# Patient Record
Sex: Male | Born: 1990 | Race: White | Hispanic: No | Marital: Married
Health system: Midwestern US, Academic
[De-identification: ages and names within clinical notes are randomized; demographics above are authoritative.]

---

## 2016-06-01 IMAGING — CR LOW_EXM
2 series · 2 of 2 positions shown · non-contrast
Comparison: none

[foot]
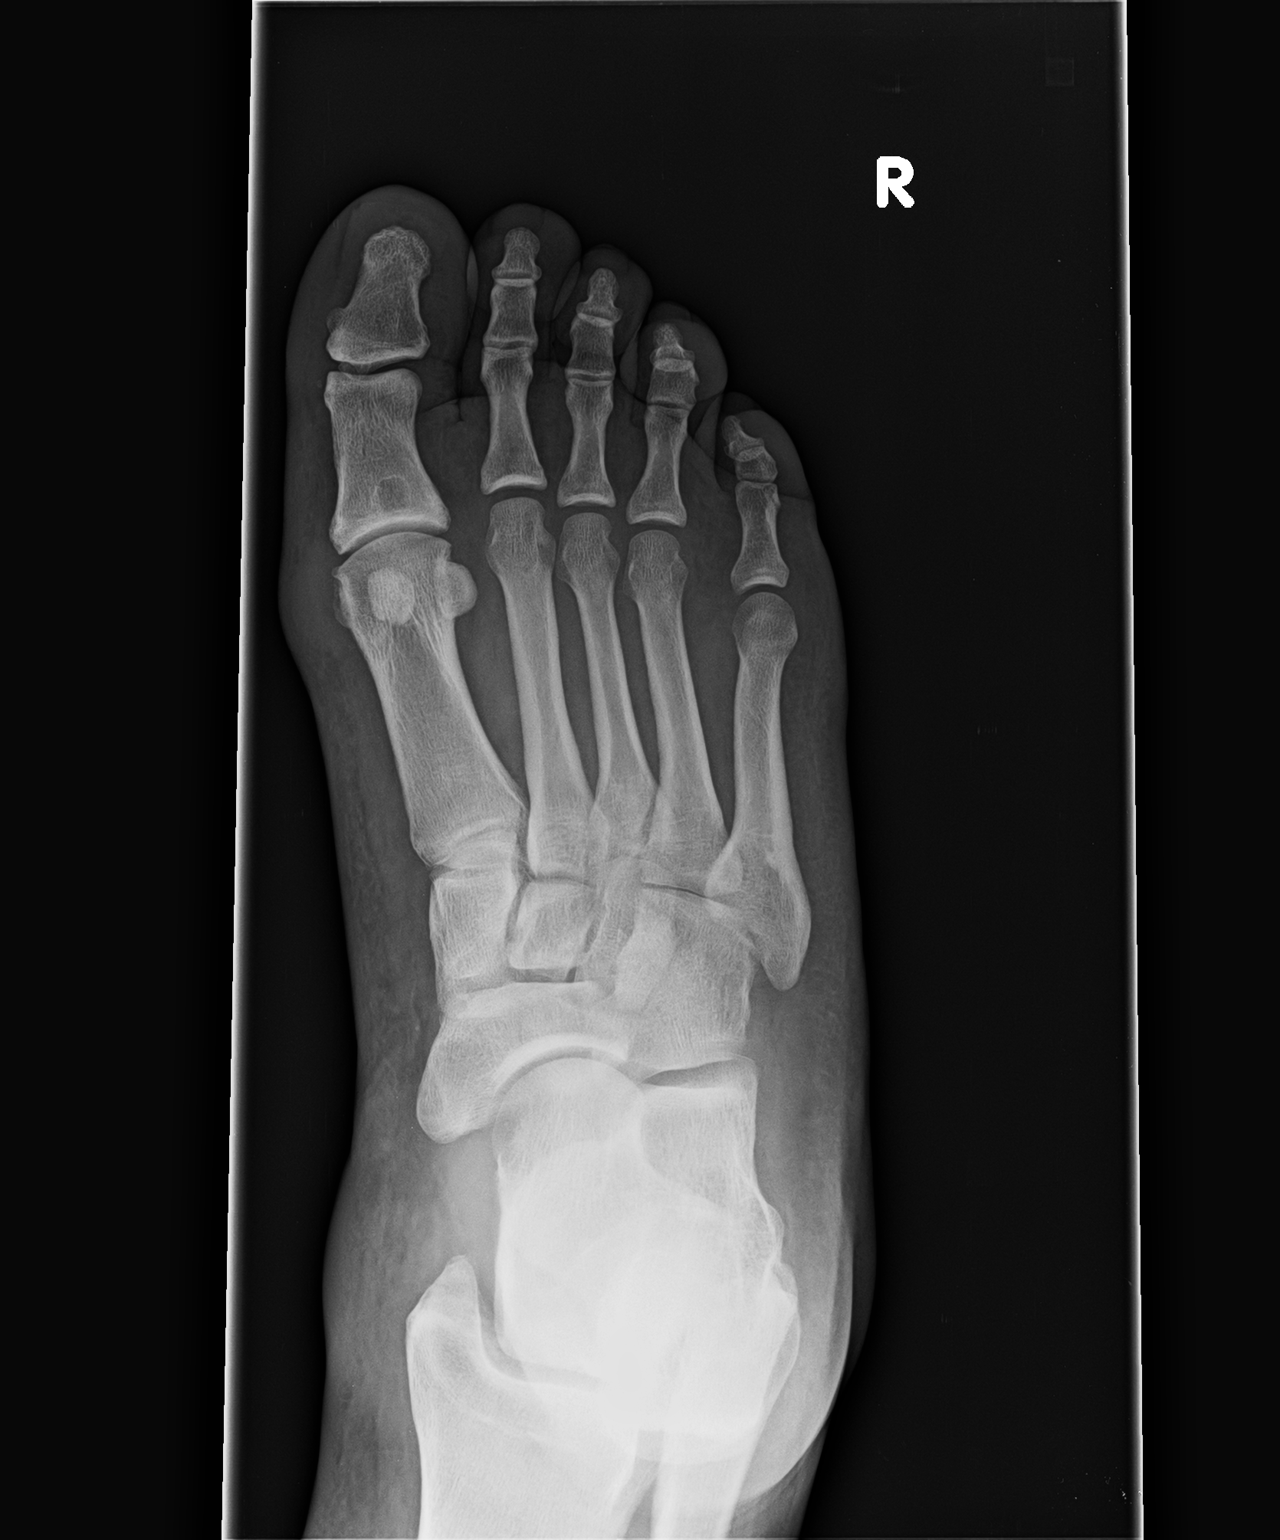

[foot lat]
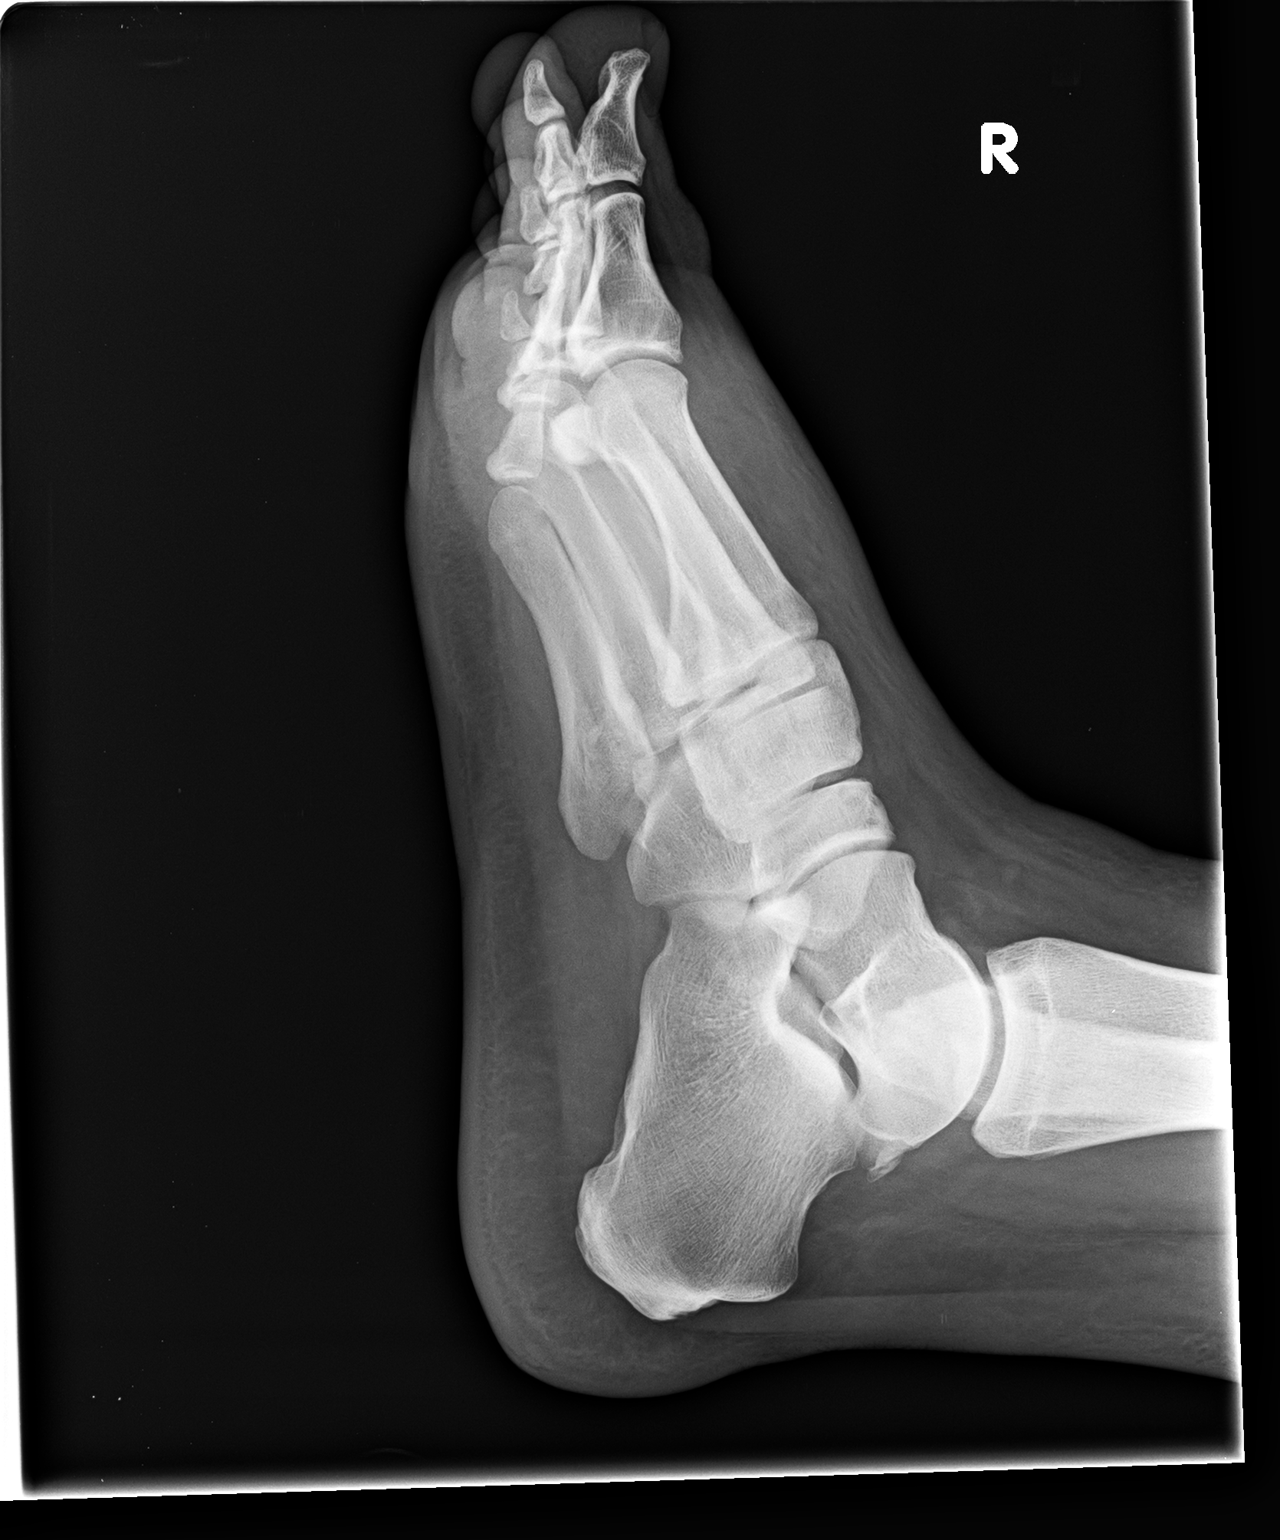

[2 of 2 positions shown; findings below may reference images not displayed]

DIAGNOSTIC STUDIES

EXAM

INDICATION

Hx gout flare of R 1st MTP. Atrumatic pain, swelling same loc.
C/O FOOT PAIN AND SWELLING W/O INJURY. H/O GOUT. PT STATES FEELS LIKE GOUT
FLARE UP, BUT HAS LASTED MUCH LONGER THAN PREVIOUS. PRIOR ANKLE FX.
SHIELDED. HB

TECHNIQUE

Two views of the foot

COMPARISONS

None

FINDINGS

Bone density is normal. There is no evidence of fracture. There is a possible soft tissue tophus
lesion involving the medial 1st metacarpophalangeal joint with an adjacent erosion of the head of
the 1st metatarsal medially and the medial base of the proximal phalanx. Additionally, there is a
cystic lesion in the base of the proximal phalanx of the 1st toe. These can all findings can all be
seen in gout. There is mild dorsal soft tissue swelling. No fracture is seen.

IMPRESSION

findings are consistent with the clinical diagnosis of gout with possible tophus soft tissue
tophus medially adjacent to the 1st metatarsophalangeal joint and probable cyst in the base of the
1st toe proximal phalanx. No fracture

## 2017-04-05 IMAGING — CR LOW_EXM
2 series · 2 of 2 positions shown · non-contrast
Comparison: none

[ankle ap]
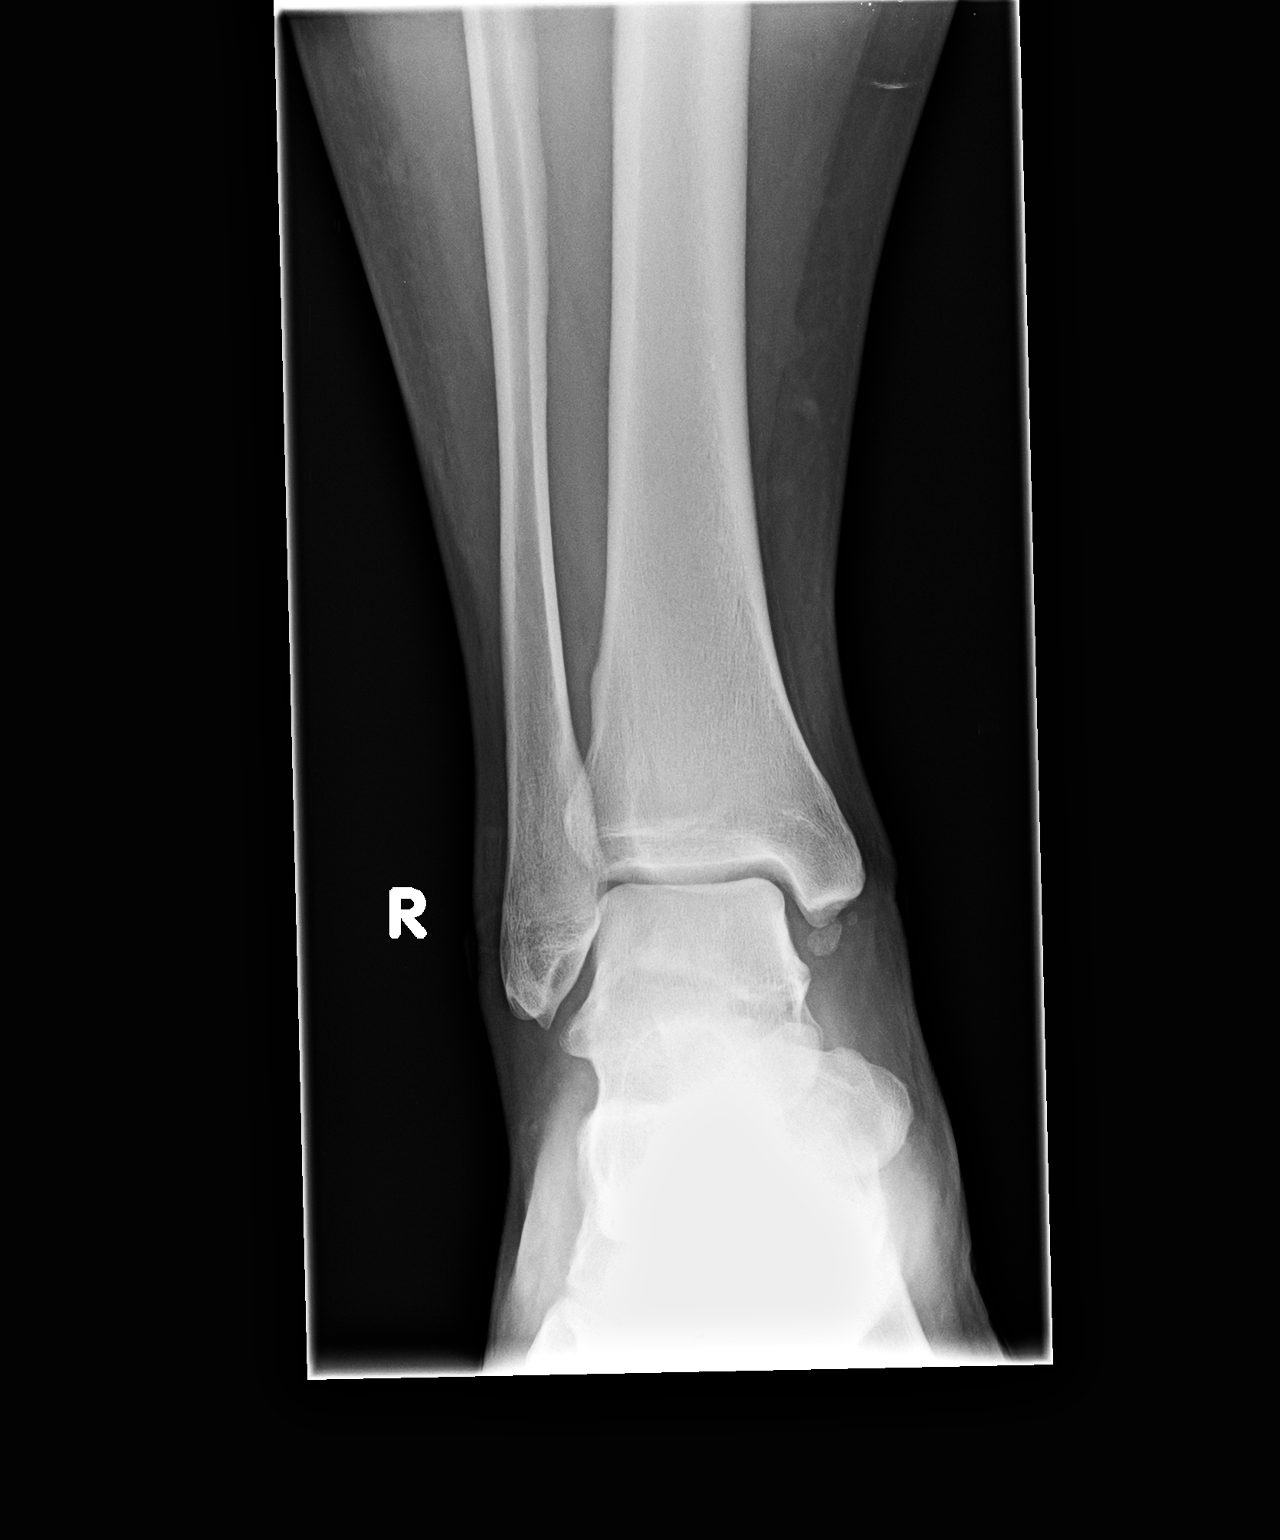

[ankle lat]
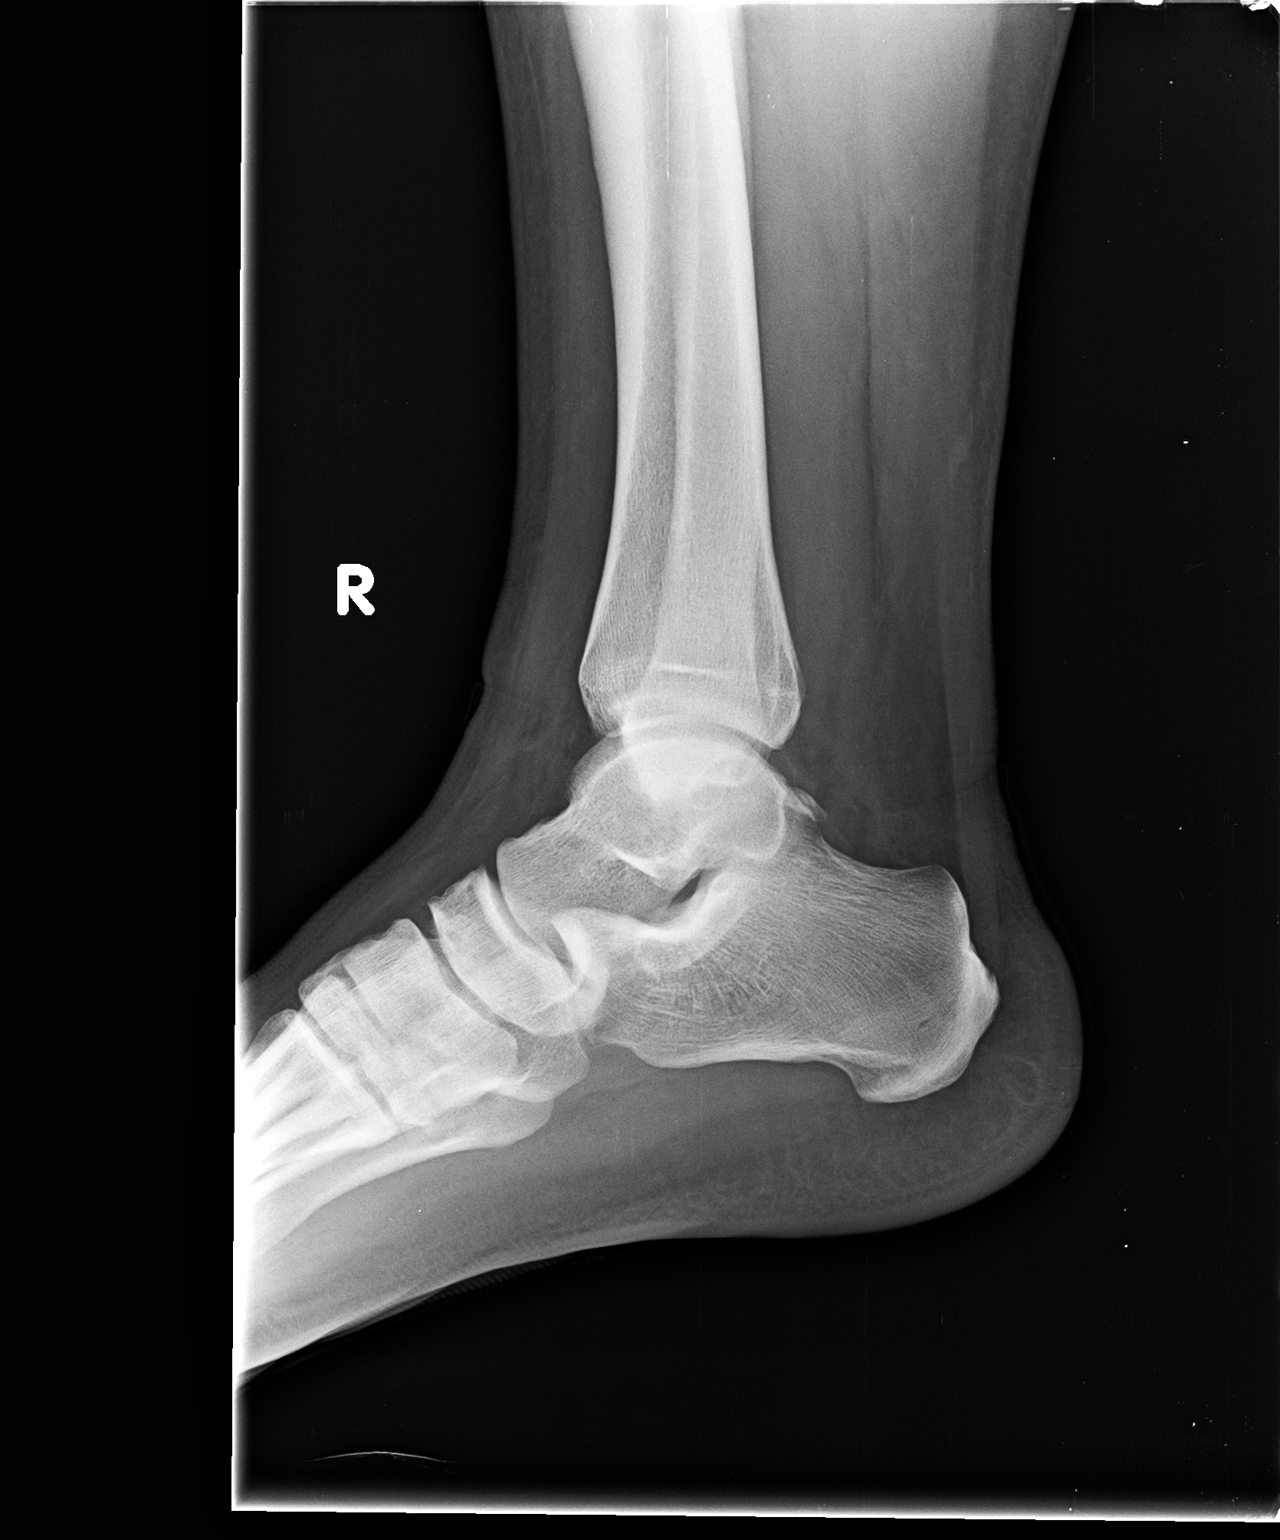

[2 of 2 positions shown; findings below may reference images not displayed]

DIAGNOSTIC STUDIES

EXAM

Two-view right ankle

INDICATION

Right ankle pain, gout.

TECHNIQUE

Frontal and lateral views

COMPARISONS

None

FINDINGS

Sequela of old injury or ossicles are noted along the medial malleolus. No acute displaced
fracture or malalignment. Talar dome is smooth. No erosive change. No destructive bone lesion.

IMPRESSION

No acute bony abnormality.

## 2018-06-19 IMAGING — US VDUPLERT
1 series · 14 of 25 positions shown · non-contrast
Comparison: none

PROCEDURE: VDUPLERT
HISTORY: pain, swelling rle

[Series 1: us venous duplex low ext right · portal-venous · 14 of 30 slices shown]
[im 1/30]
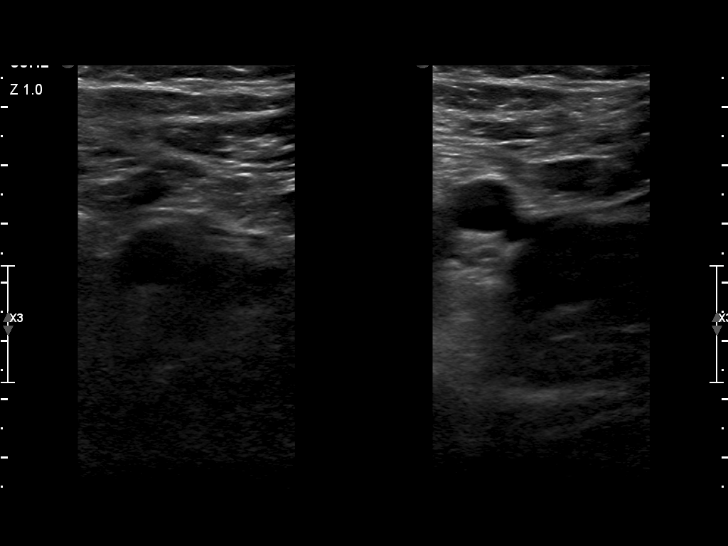
[im 3/30]
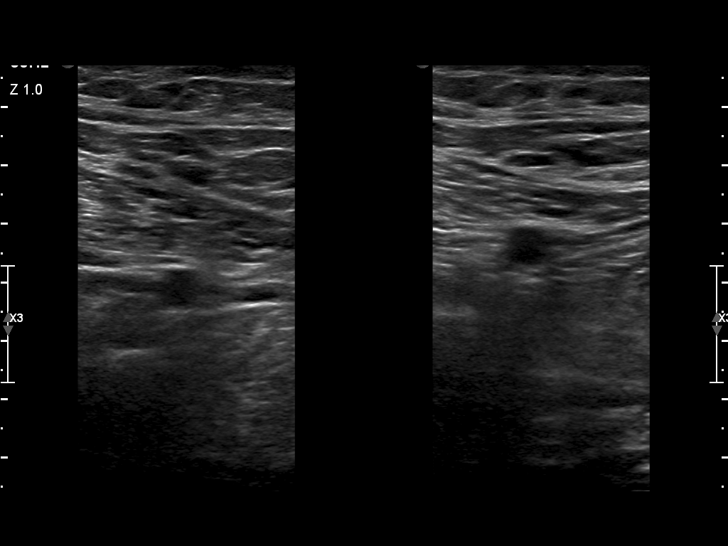
[im 5/30]
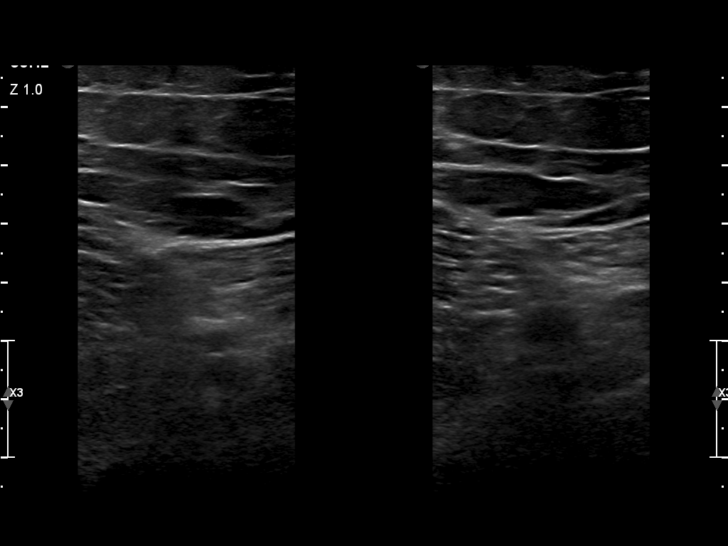
[im 8/30]
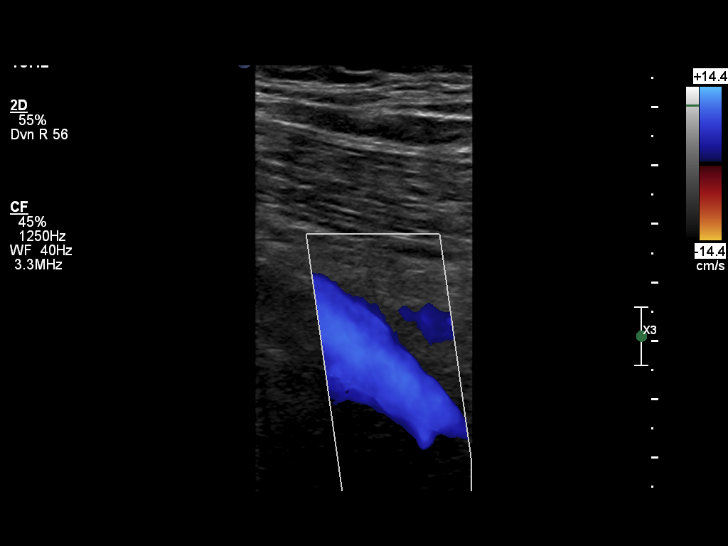
[im 10/30]
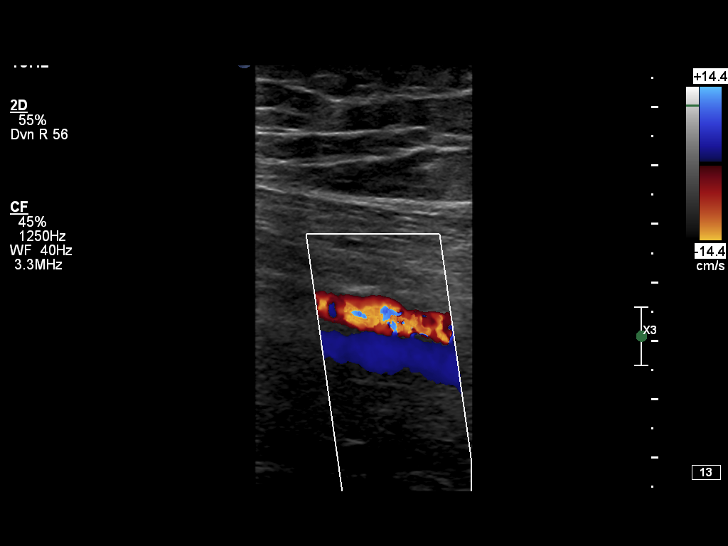
[im 11/30]
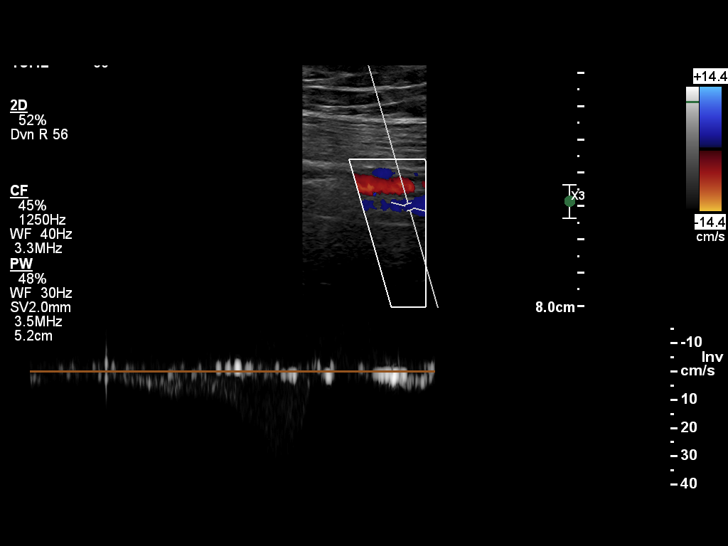
[im 14/30]
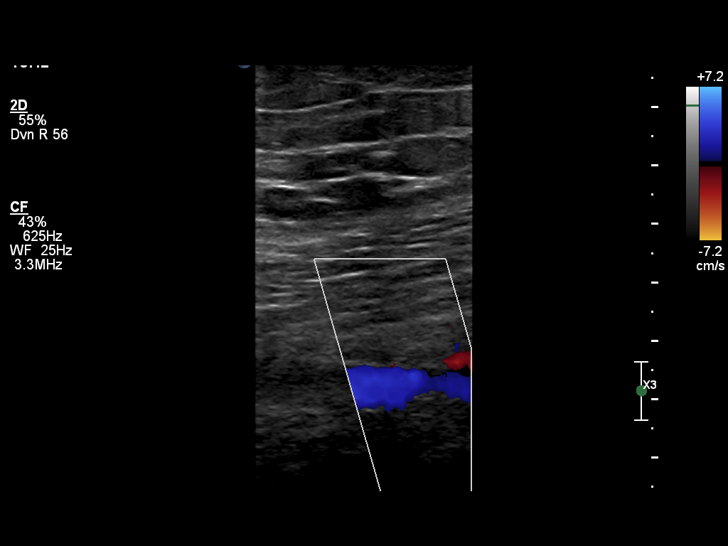
[im 16/30]
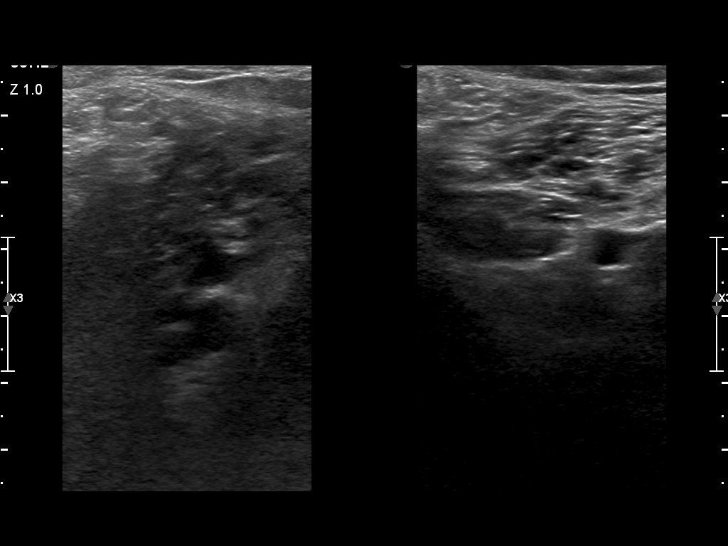
[im 19/30]
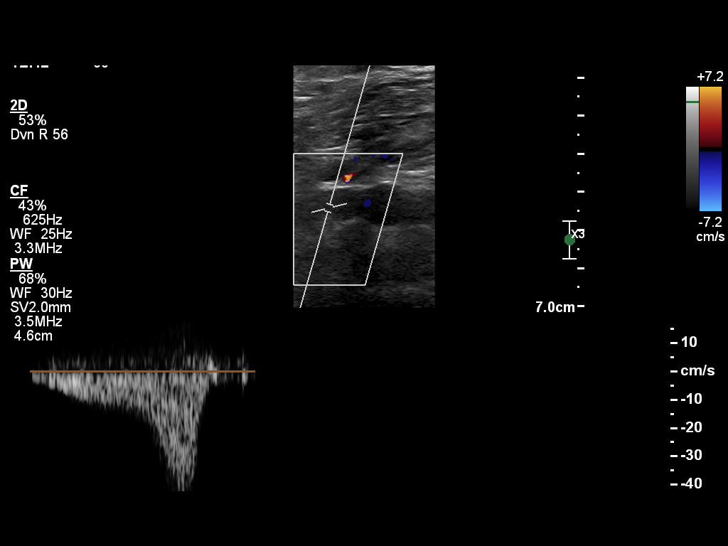
[im 20/30]
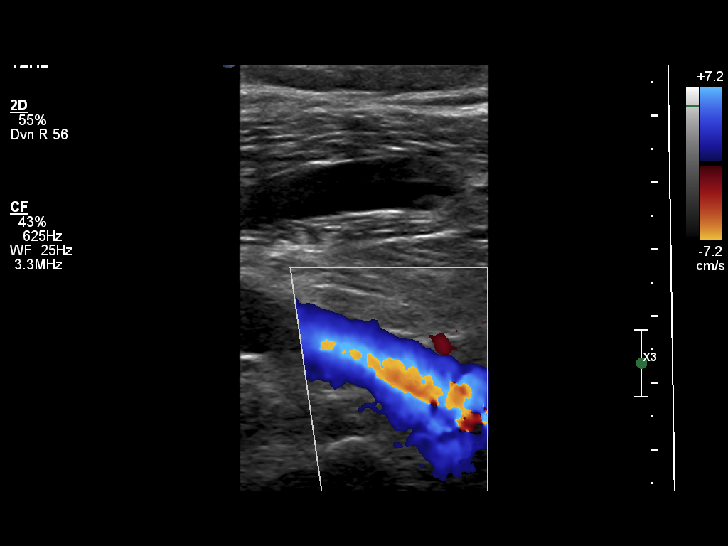
[im 22/30]
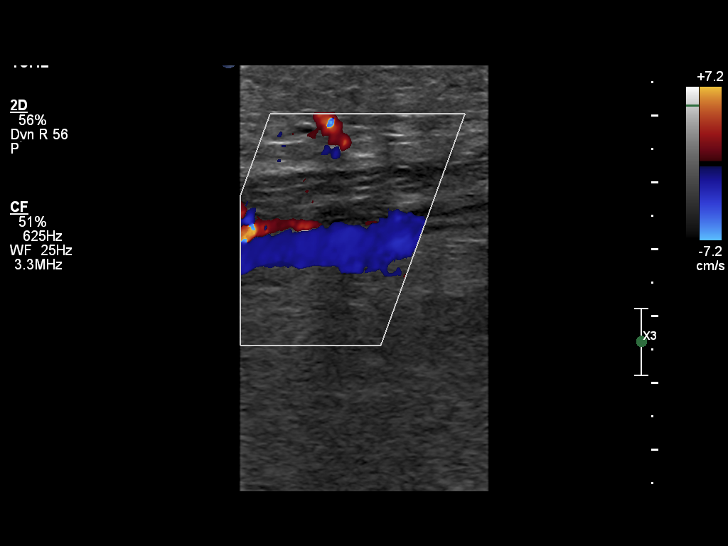
[im 25/30]
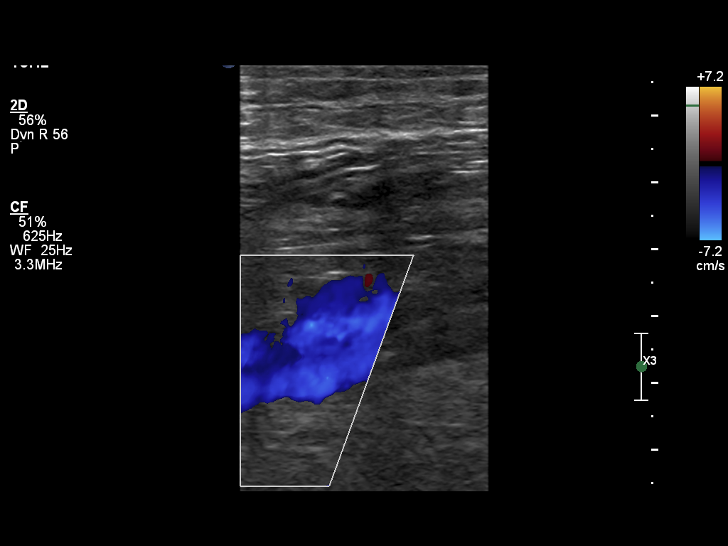
[im 27/30]
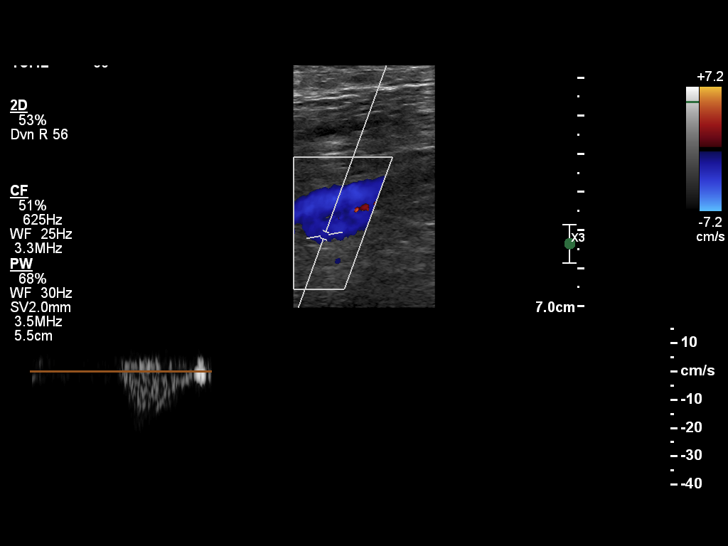
[im 30/30]
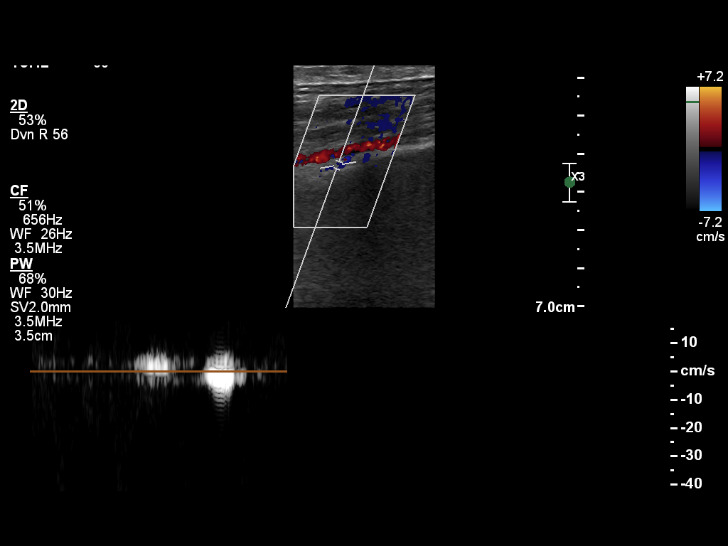

[14 of 25 positions shown; findings below may reference images not displayed]

FINDINGS: Duplex scan was performed using B-mode/gray scale imaging and Doppler spectral analysis
and
color flow of the right lower extremity shows no echogenic thrombus in the common femoral,
superficial
femoral, or popliteal veins. There is good compressibility of the compressible portions of the deep
venous
system. Normal color Doppler flow and phasic spectral Doppler wave forms are seen. Appropriate
response
is seen to augmentation.
IMPRESSION: No deep venous thrombosis is seen in the right lower extremity.

Tech Notes:

jl

## 2018-10-06 IMAGING — CR LOW_EXM
1 series · 1 of 1 positions shown · non-contrast
Comparison: none

[ankle lat]
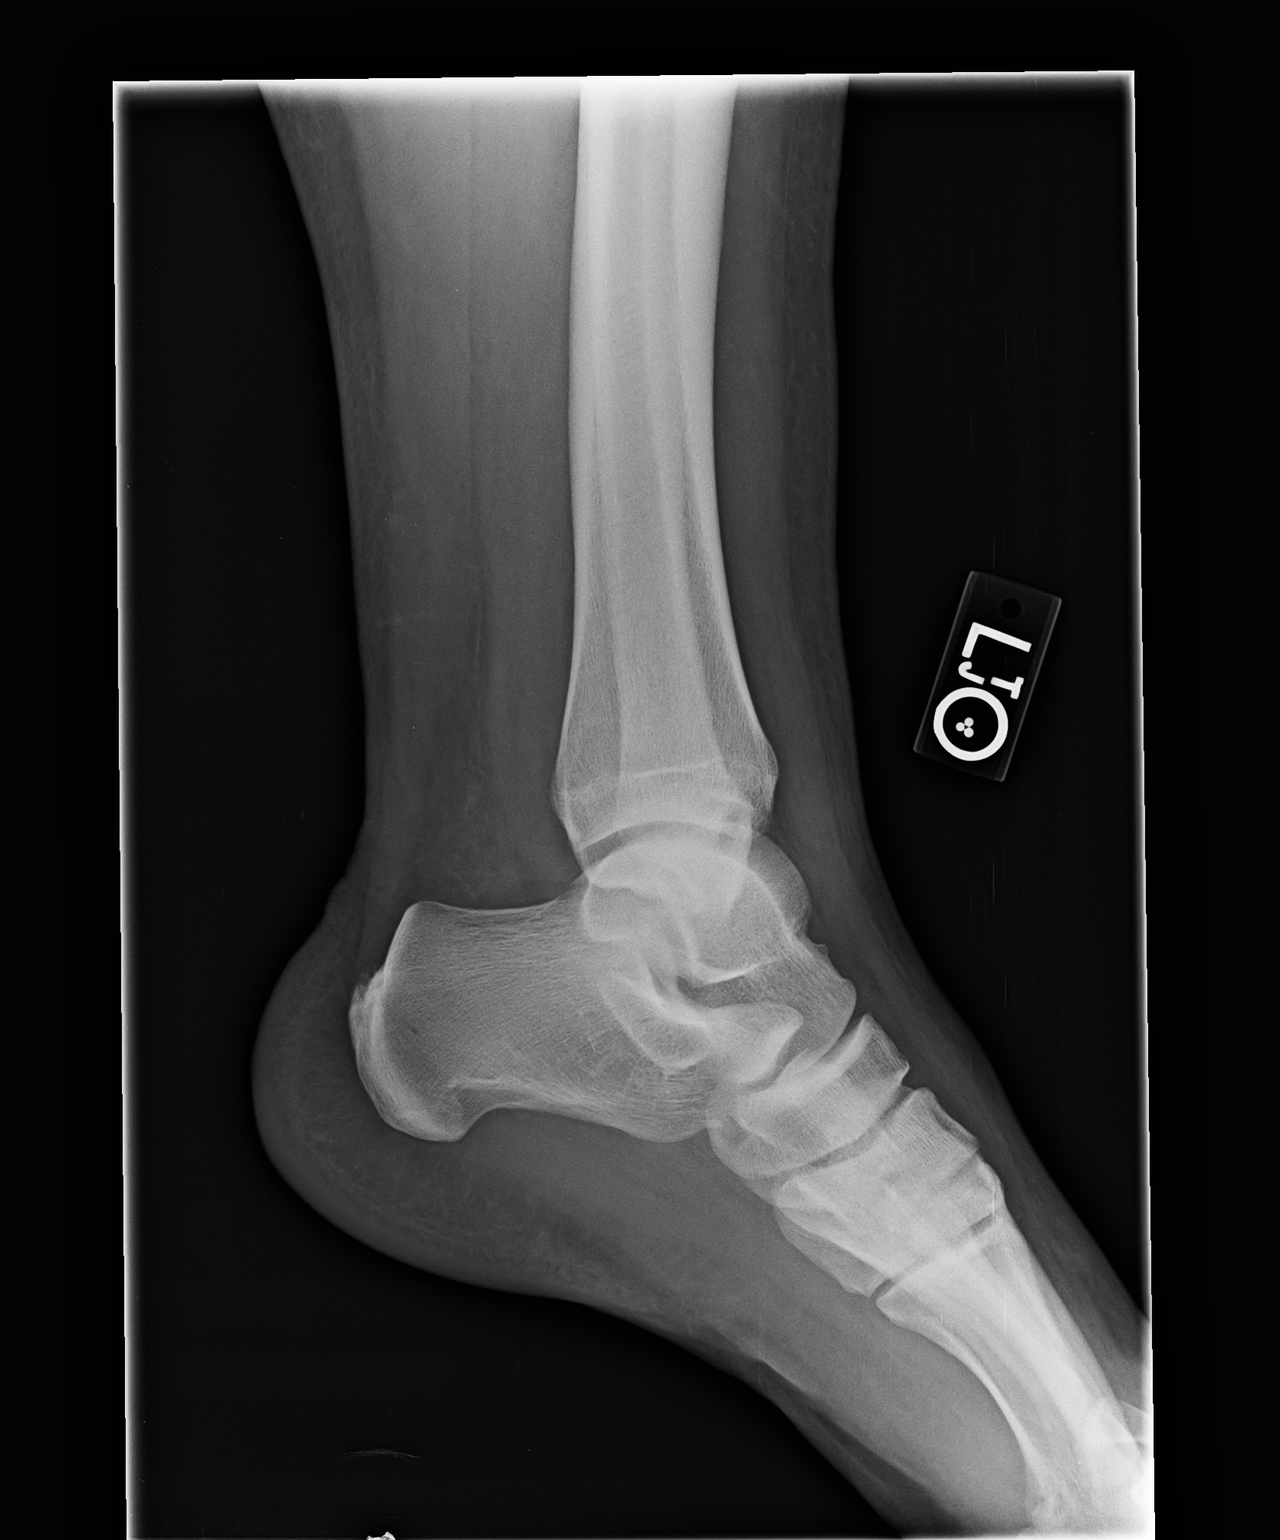

[1 of 1 positions shown; findings below may reference images not displayed]

EXAM

3 VIEW LEFT ANKLE

INDICATION

Left ankle pain

COMPARISONS

None available

FINDINGS

The ankle mortise is congruent without displaced fracture or malalignment. The talar dome is smooth
without focal lucency. No destructive bone lesion is identified. Ossicle or sequela of old injury
adjacent to the medial malleolus.

IMPRESSION

No acute bony abnormality.

Tech Notes:

PT C/O LEFT ANKLE PAIN. NKI. HX OF LEFT ANKLE FX. TJ

## 2019-03-01 IMAGING — CR LOW_EXM
3 series · 3 of 3 positions shown · non-contrast
Comparison: No relevant prior studies available.

DIAGNOSTIC STUDIES

EXAM:  XR RIGHT KNEE, 3 VIEWS  ([id number])
INDICATION: trauma injury PT c/o states pain to medial Rt knee. Pt states pain w/ bending knee. Pt
states injury from running into cough. Shielded. JC ATH

[knee ap]
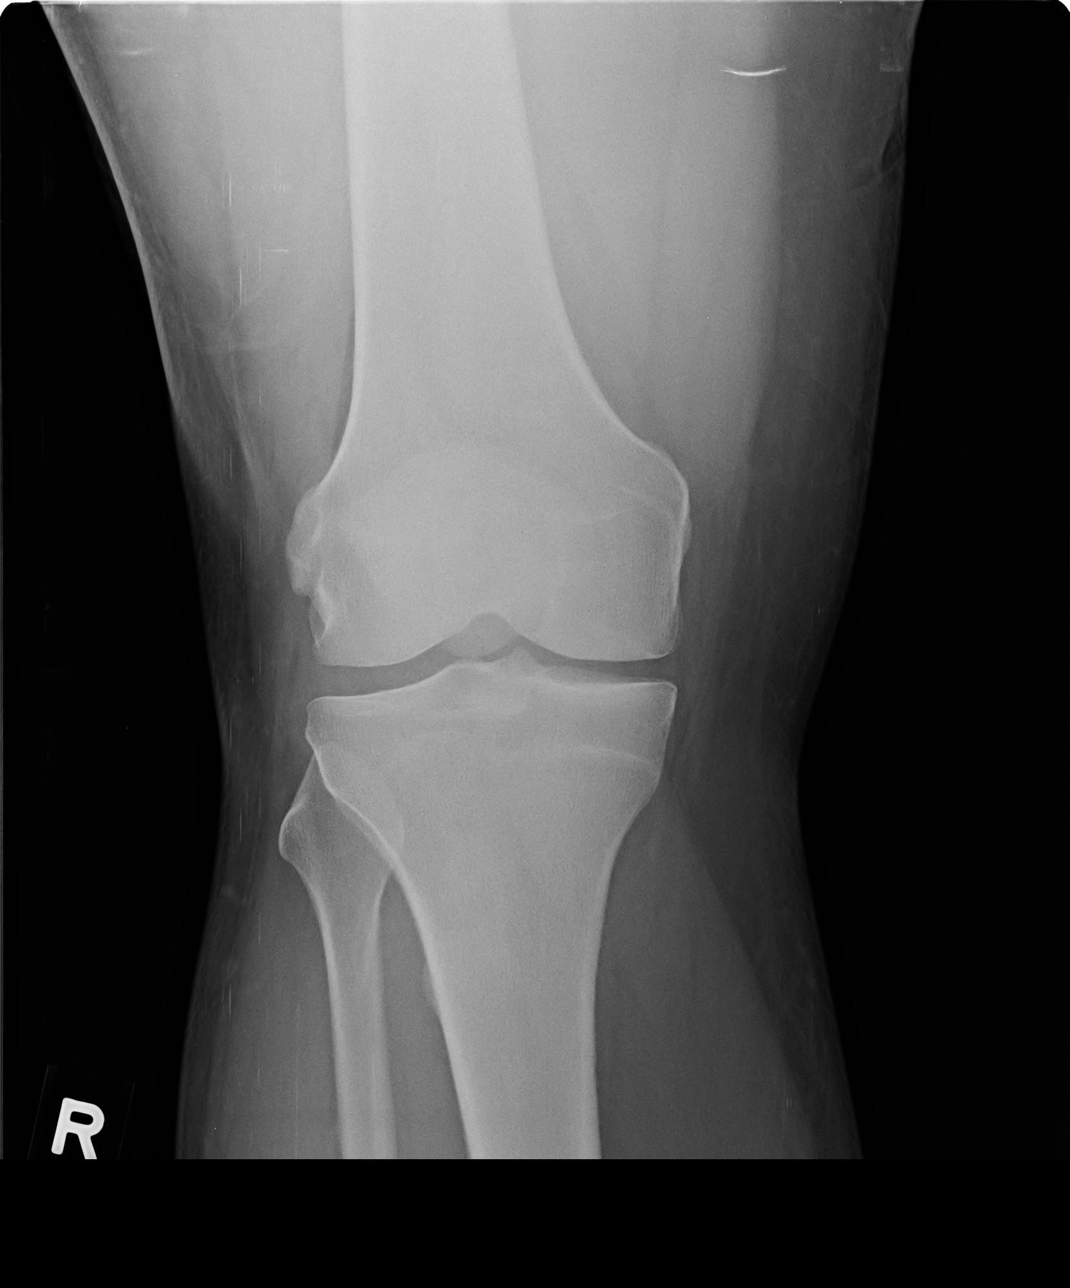

[knee sunrise]
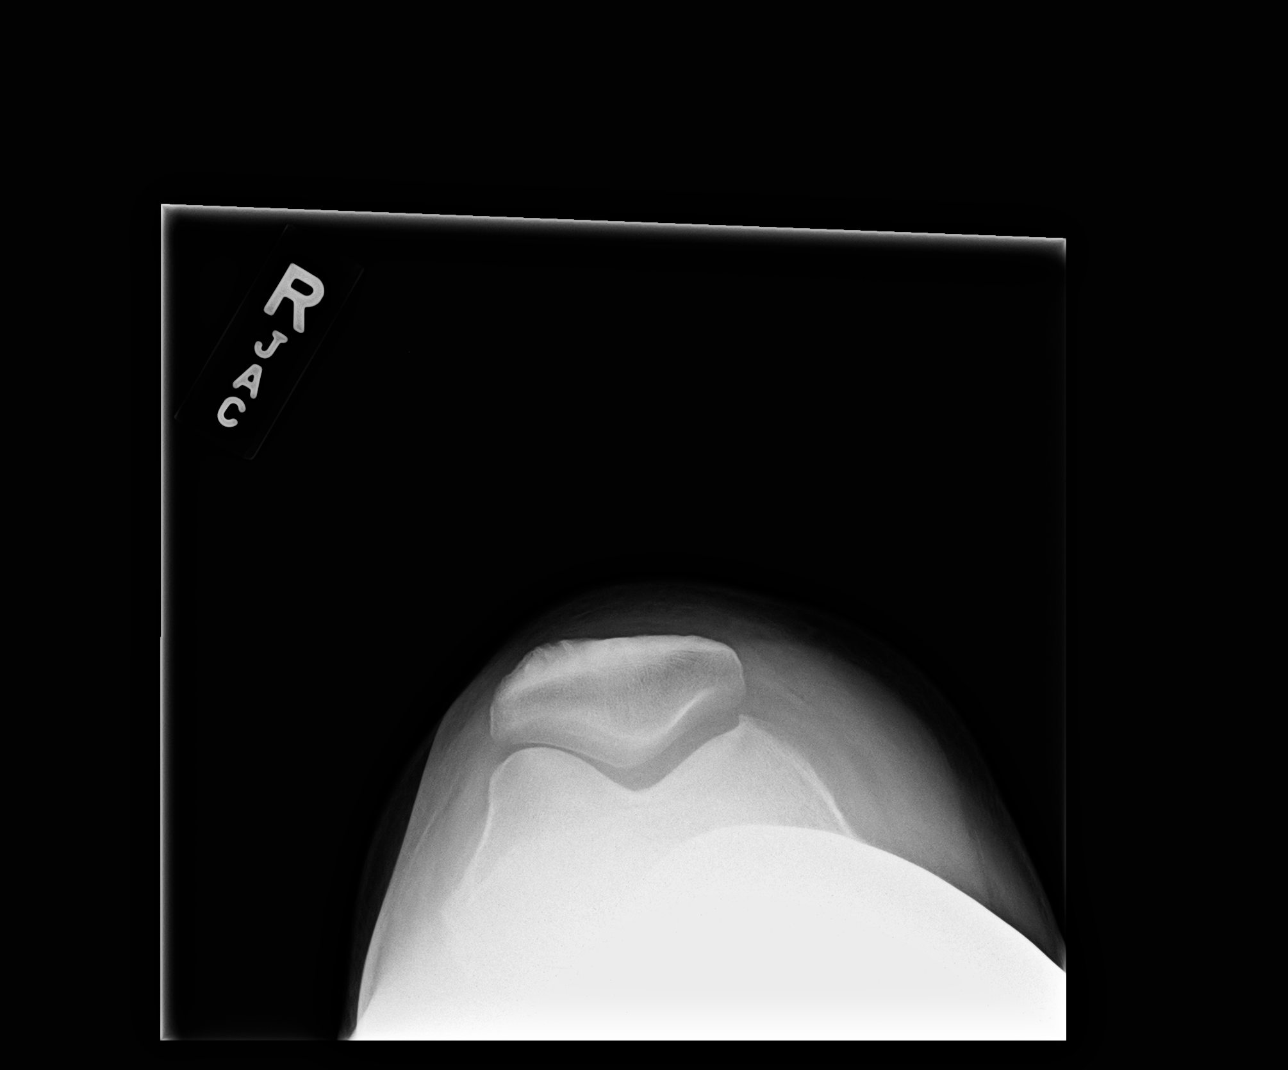

[knee lat]
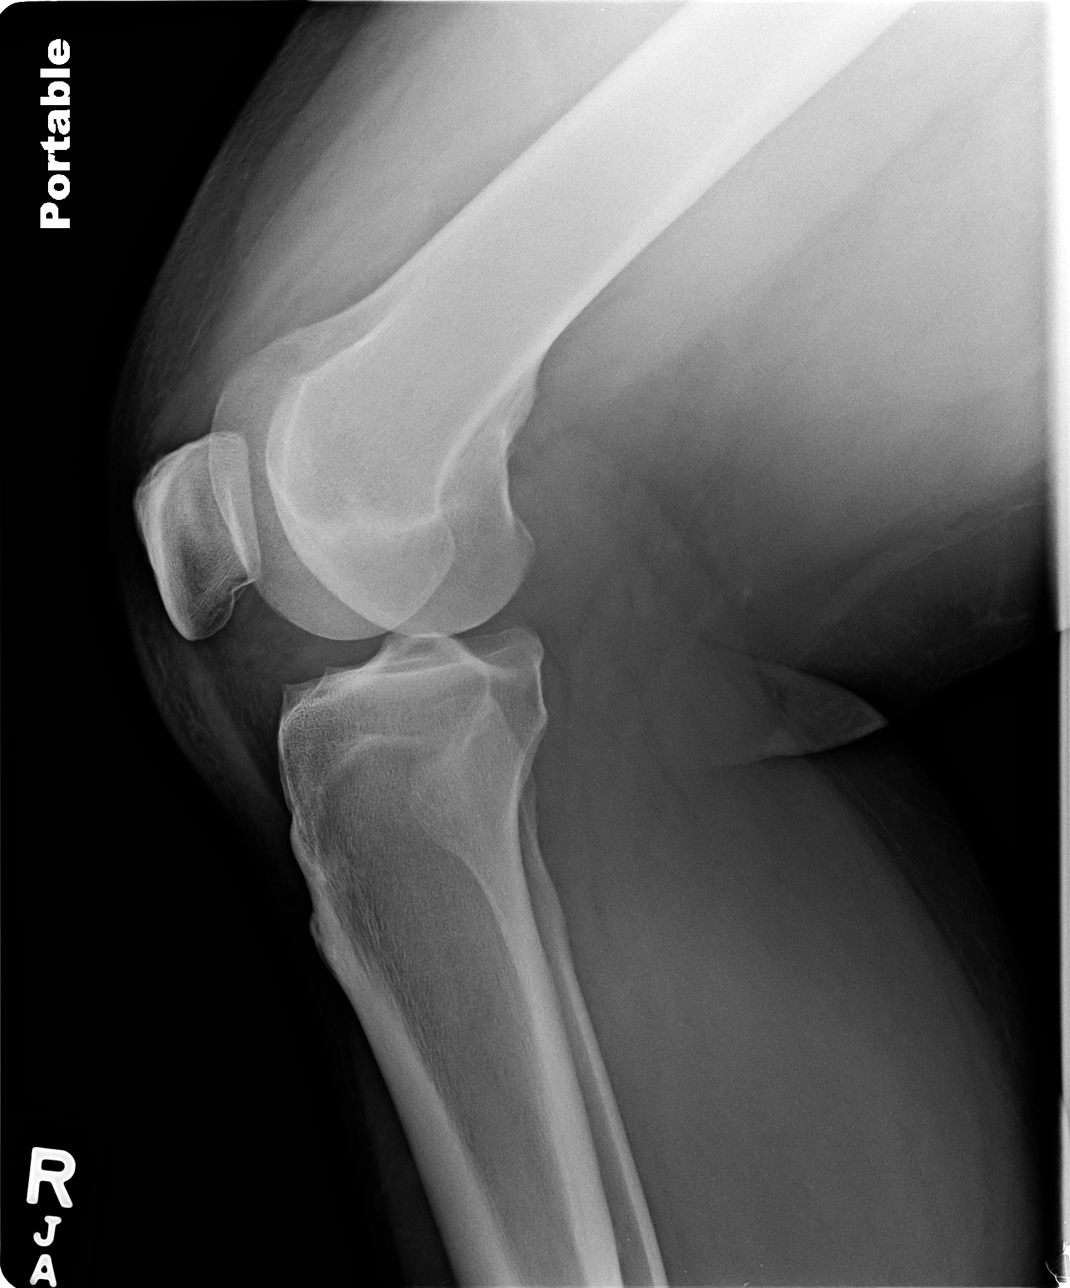

[3 of 3 positions shown; findings below may reference images not displayed]

FINDINGS: BONES/JOINTS:  NO definite fracture or dislocation.

SOFT TISSUES:  No radiopaque foreign body.
IMPRESSION: - There is NO evidence of acute fractures or dislocations.

Tech Notes:

PT c/o states pain to medial Rt knee. Pt states pain w/ bending knee. Pt states injury from running
into cough. Shielded. JC

## 2019-03-12 IMAGING — MR Knee^Routine
7 of 8 series · 32 of 40 positions shown · non-contrast
Comparison: none

[Series 3: T2 fat-sat · axial · 4.0mm · 0.50mm/px · z∈[-37,+68]mm · 4 of 24 slices shown (1 of 3)]
[im 1/24]
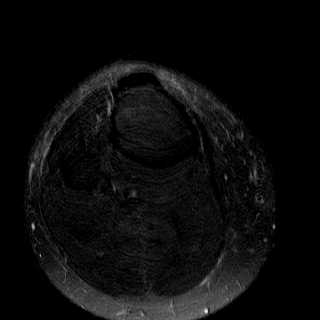
[im 8/24]
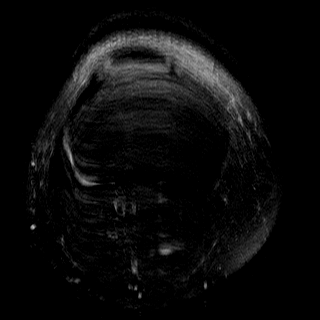
[im 16/24]
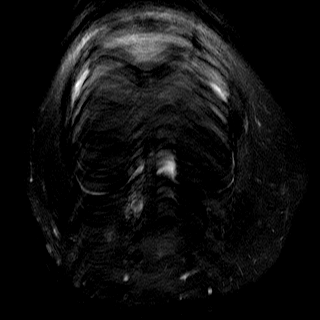
[im 24/24]
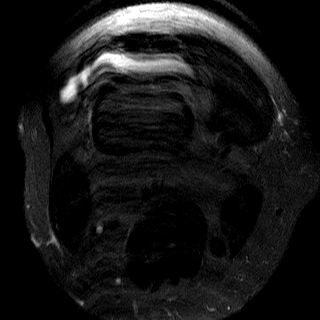

[Series 4: STIR · axial · 4.0mm · 0.29mm/px · z∈[-29,+67]mm · 5 of 22 slices shown (1 of 2)]
[im 1/22]
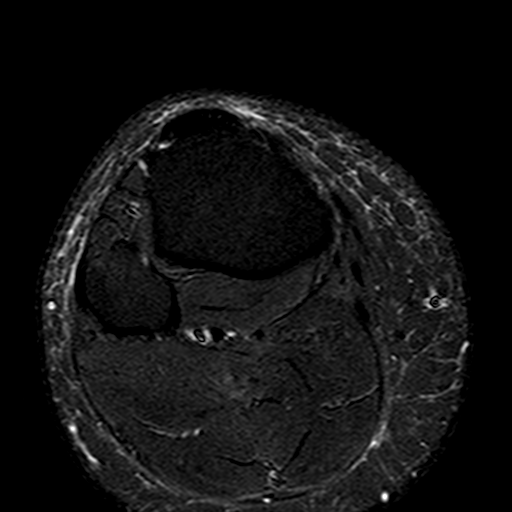
[im 6/22]
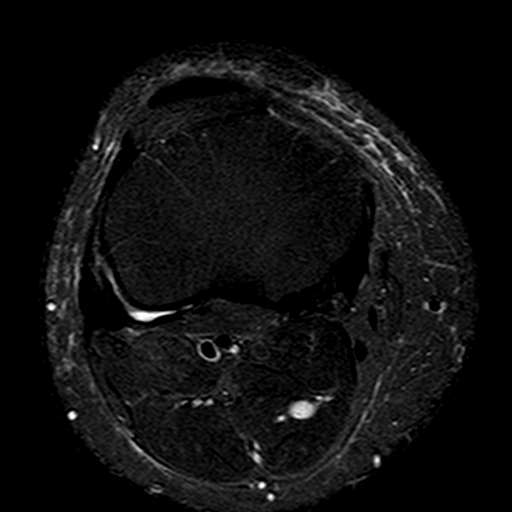
[im 11/22]
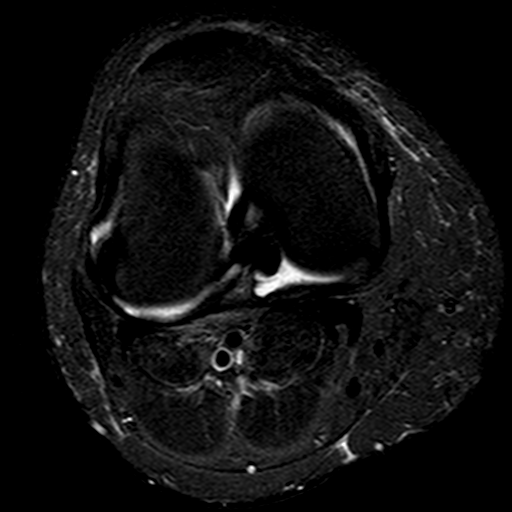
[im 16/22]
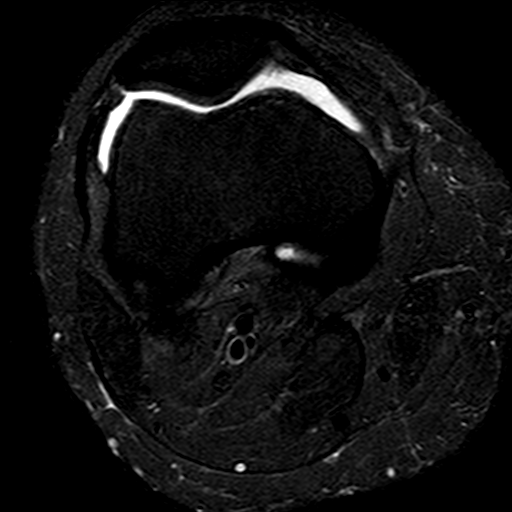
[im 22/22]
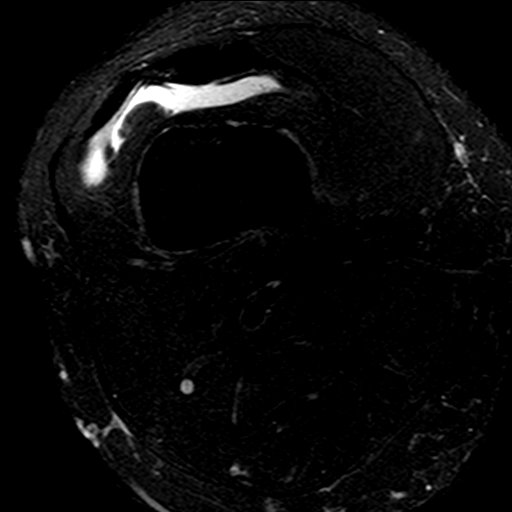

[Series 5: T1 · coronal · 4.0mm · 0.62mm/px · 6 of 25 slices shown]
[im 1/25]
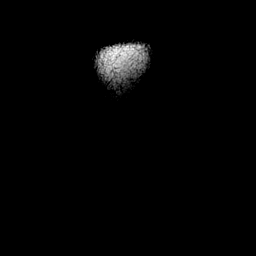
[im 5/25]
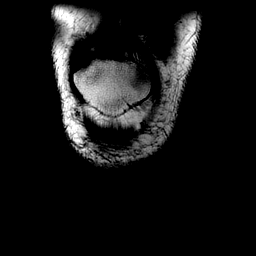
[im 10/25]
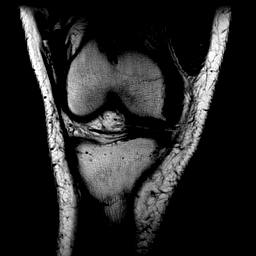
[im 15/25]
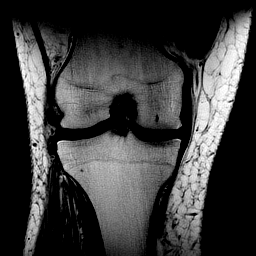
[im 20/25]
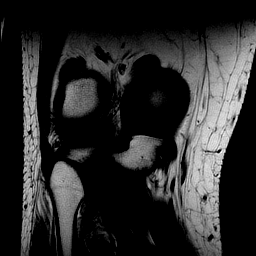
[im 25/25]
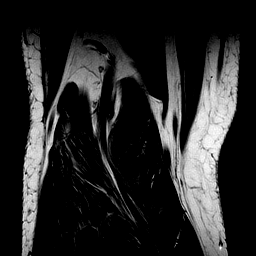

[Series 6: T2 fat-sat · coronal · 3.5mm · 0.50mm/px · 5 of 24 slices shown (2 of 3)]
[im 1/24]
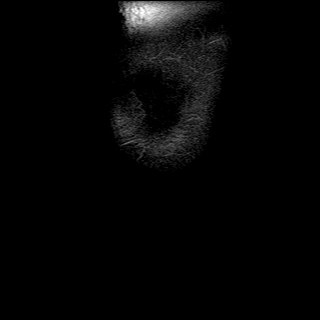
[im 6/24]
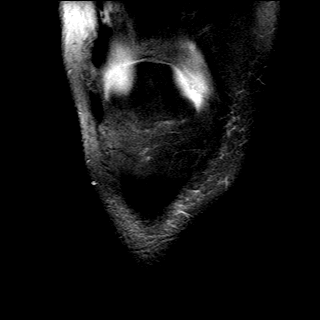
[im 12/24]
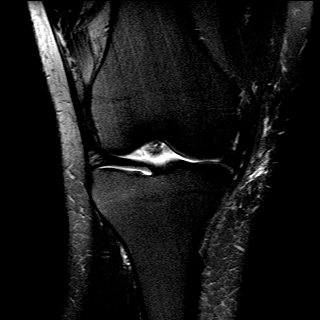
[im 18/24]
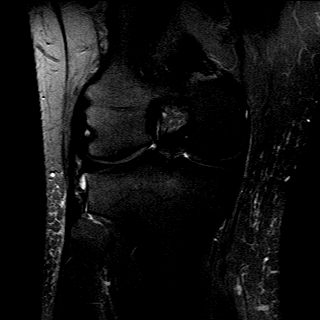
[im 24/24]
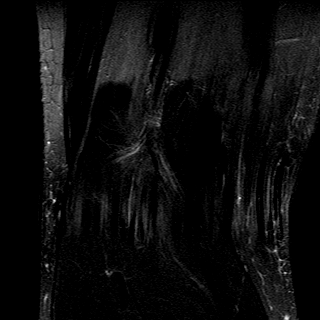

[Series 7: STIR · coronal · 4.0mm · 0.29mm/px · 2 of 21 slices shown (2 of 2)]
[im 1/21]
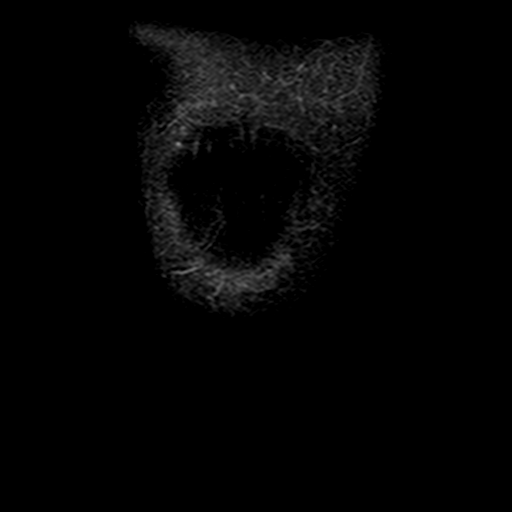
[im 6/21]
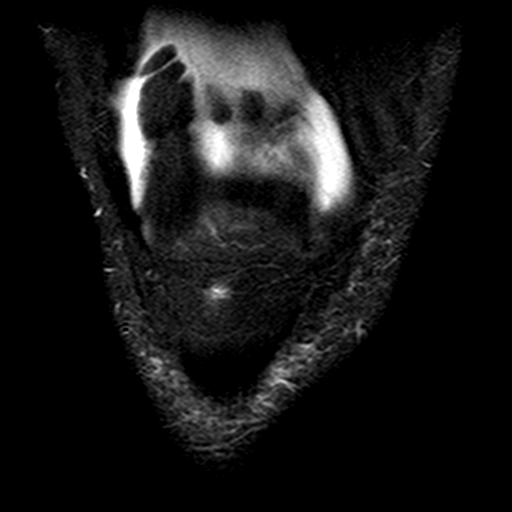

[Series 8: T2 fat-sat · sagittal · 4.0mm · 0.50mm/px · 6 of 25 slices shown (3 of 3)]
[im 1/25]
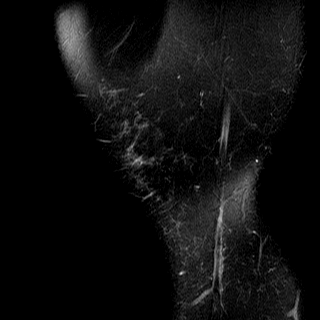
[im 5/25]
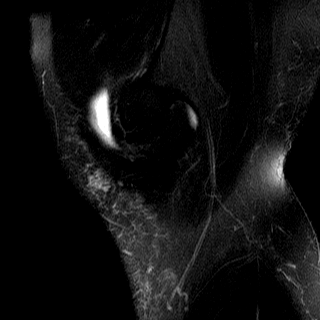
[im 10/25]
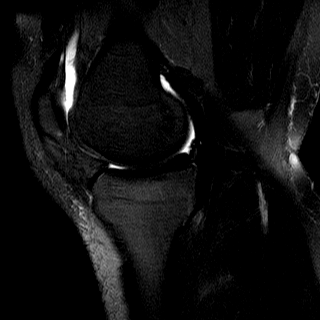
[im 15/25]
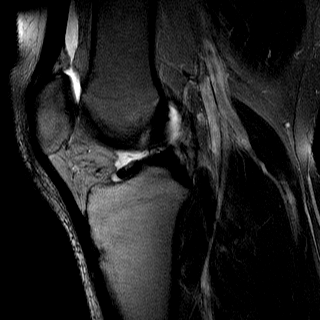
[im 20/25]
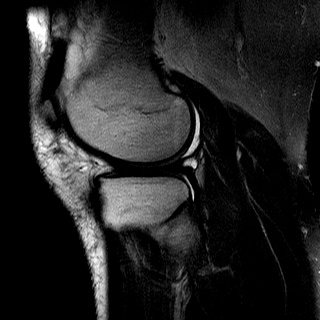
[im 25/25]
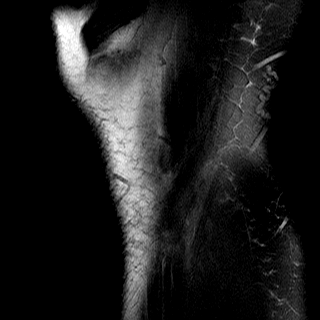

[Series 9: PD fat-sat · sagittal · 4.0mm · 0.29mm/px · 4 of 18 slices shown]
[im 1/18]
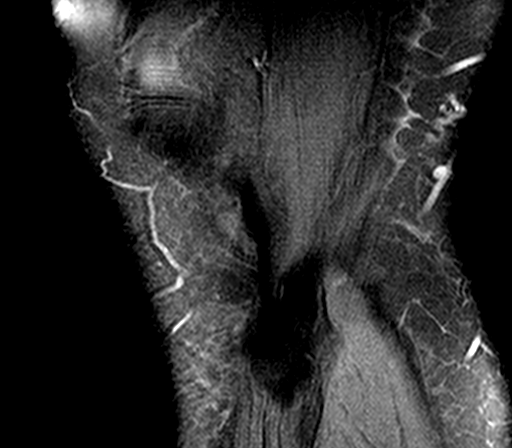
[im 6/18]
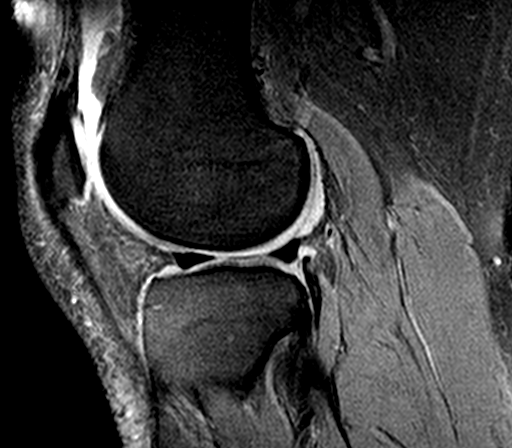
[im 12/18]
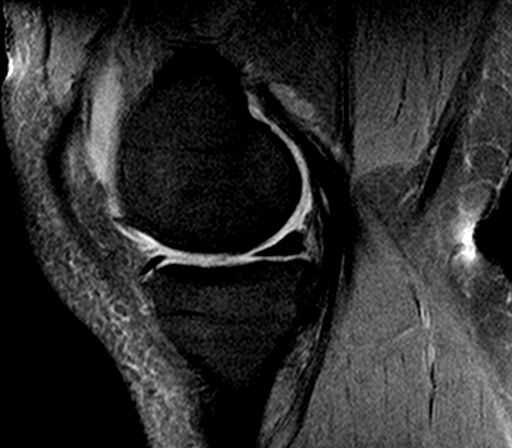
[im 18/18]
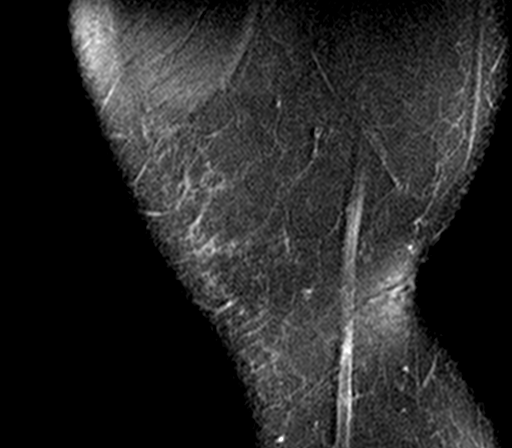

[32 of 40 positions shown; findings below may reference images not displayed]

EXAM

MR knee RT wo con

INDICATION

Knee pain after falling

TECHNIQUE

MRI of the knee was performed. Sequences include coronal, sagittal, and axial planes with fast spin
echo technique, both in fat saturated and non-fat saturated. No intravenous contrast was
administered.

COMPARISONS

XR right knee 03/01/2019

FINDINGS

MEDIAL COMPARTMENT: Intact medial meniscus. No focal chondrosis or subchondral edema.

LATERAL COMPARTMENT: Intact lateral meniscus. No focal chondrosis or subchondral edema.

PATELLOFEMORAL COMPARTMENT: No focal chondrosis or subchondral edema.

CRUCIATE LIGAMENTS: Intact.

MEDIAL SUPPORTING STRUCTURES: Intact superficial and deep medial collateral ligaments.

LATERAL SUPPORTING STRUCTURES: Intact iliotibial band, lateral capsular ligament, lateral
collateral ligament, biceps femoris tendon, and popliteus tendon.

EXTENSOR MECHANISM: Intact.

JOINT SPACE/FLUID: Small knee joint effusion. No Baker's cyst.

BONES: No acute fracture, osseous contusion, or aggressive focal osseous lesion.

MUSCLES: Normal signal intensity and morphology.

NEUROVASCULAR: Normal popliteal neurovascular bundle.

IMPRESSION
1. Small nonspecific knee joint effusion. No MR evidence of an acute fracture or osseous contusion.

2. No MR evidence of internal derangement.

Tech Notes:

Knee pain, after [place] out of a [employer] on [DATE]th.
BG

## 2019-07-12 IMAGING — CR CHEST
2 series · 2 of 2 positions shown · non-contrast
Comparison: June 11, 2018.

DIAGNOSTIC STUDIES

EXAM:  XR CHEST, 2 VIEWS  ([id number])
INDICATION: CP PT C/O MID CHEST PAIN. HX SMOKER. DENIES SX HX. SHIELDED. AK PREV 06/17/18

[chest pa x-wise]
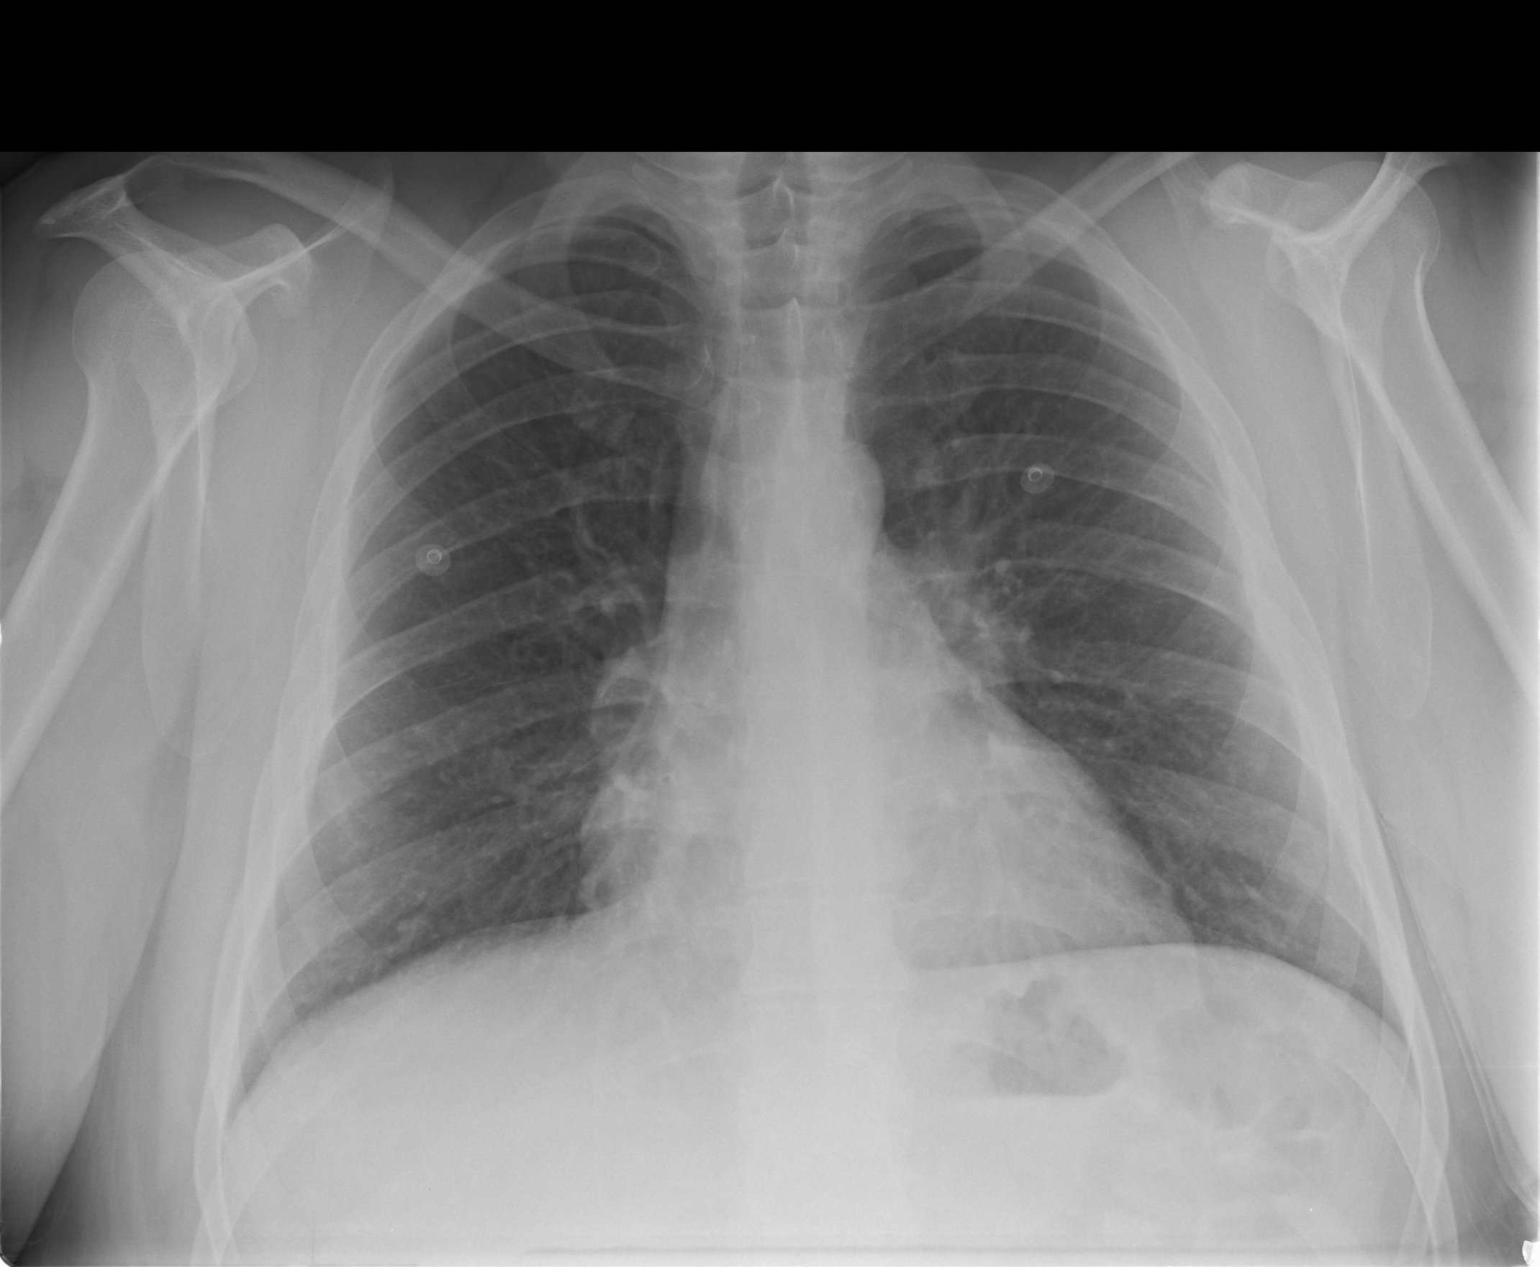

[chest lat]
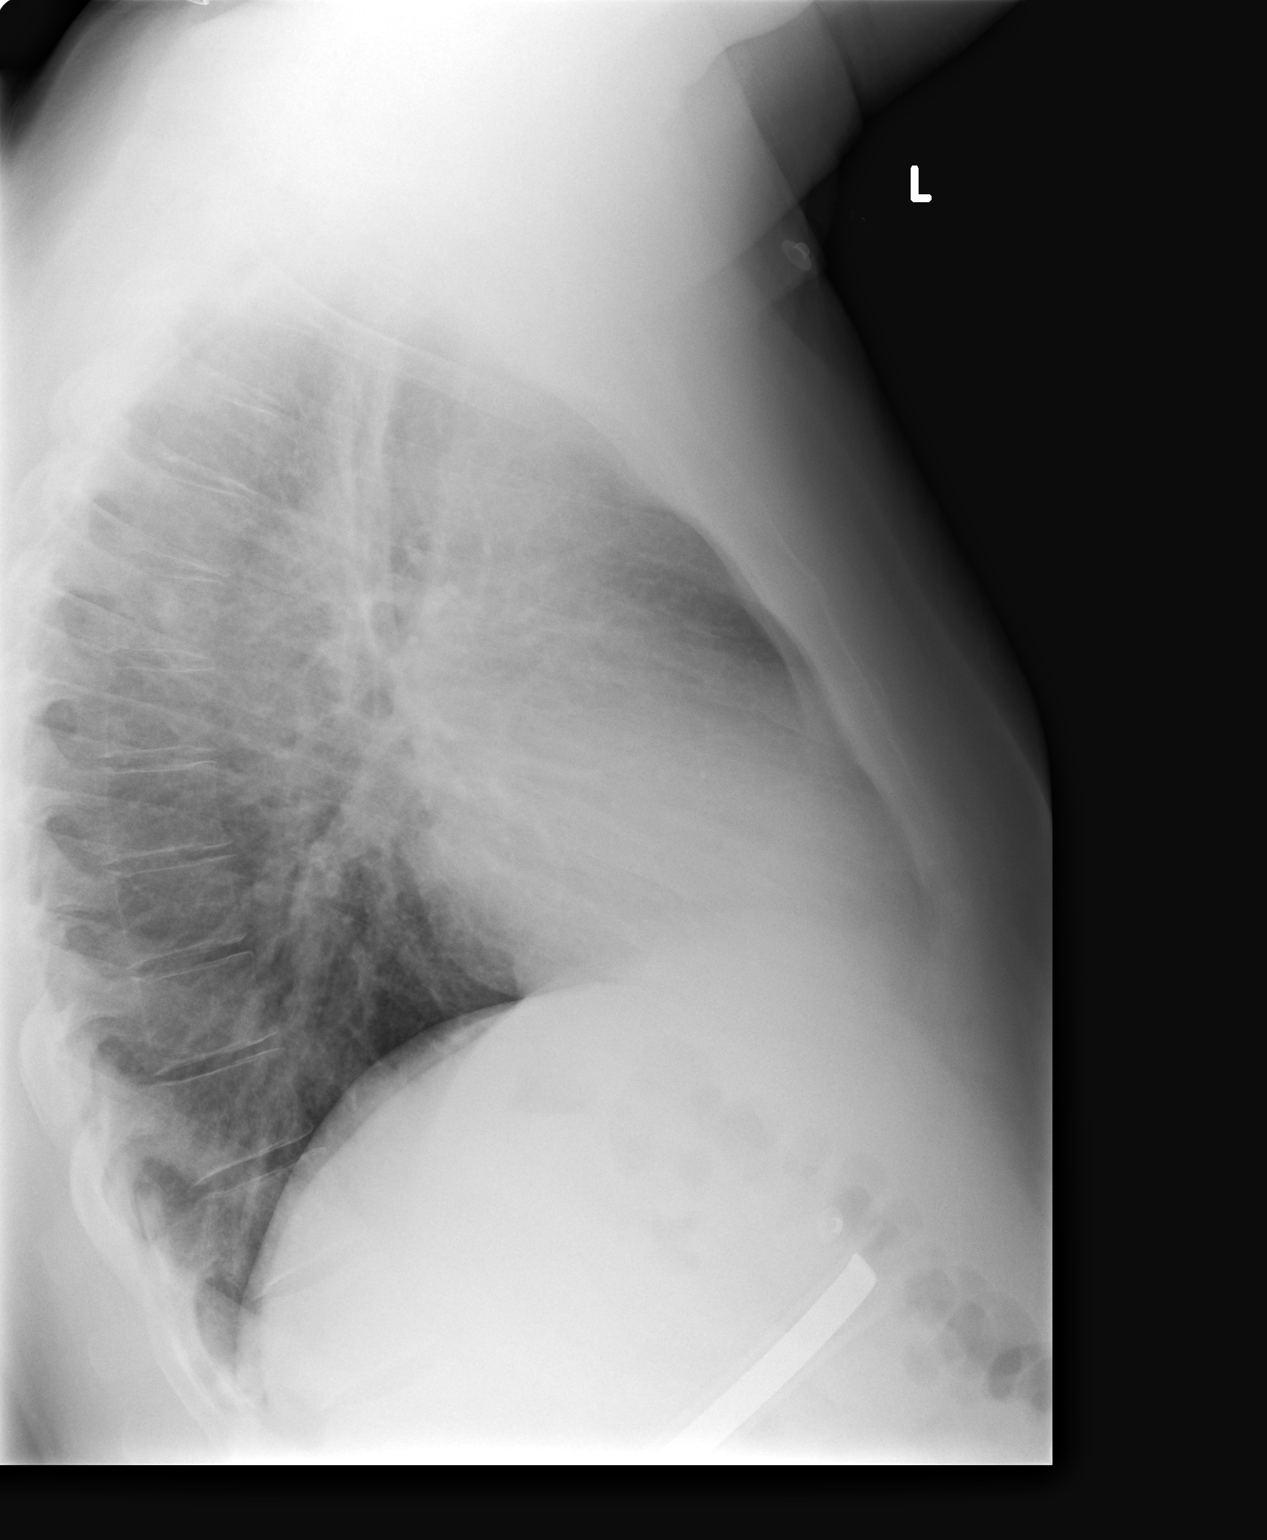

[2 of 2 positions shown; findings below may reference images not displayed]

FINDINGS: LUNGS:  NO consolidations in the lungs as visualized.  Persistent peribronchial thickening in the
perihilar areas.

HEART:  No definite acute findings.  No cardiomegaly.

BONES/JOINTS:  NO definite fracture or dislocation.
IMPRESSION: - NO consolidations in the lungs as visualized.  Persistent peribronchial thickening in the
perihilar areas.  This suggests bronchitis without pneumonia and/or significant atelectasis.

Tech Notes:

PT C/O MID CHEST PAIN. HX SMOKER. DENIES SX HX. SHIELDED. AK
PREV 06/17/18

## 2019-09-04 IMAGING — CT L_SPINE_(Adult)
3 series · 12 of 33 positions shown, 14 images · non-contrast
Comparison: No relevant prior studies available.

DIAGNOSTIC STUDIES

EXAM:  CT LUMBAR SPINE WITHOUT INTRAVENOUS CONTRAST  ([id number])
INDICATION: back and right leg pain low back pain that radiates down rt leg; no known injury
TECHNIQUE: Axial computed tomography images of the lumbar spine without intravenous contrast.
Sagittal and coronal reformatted images were created and reviewed.

[Series 2: l-spine ax 2.00 br60 s3 · axial · 0.34mm/px · z∈[+1339,+1529]mm · 4 of 121 slices shown, 5 images]
[im 19/121  soft-tissue]
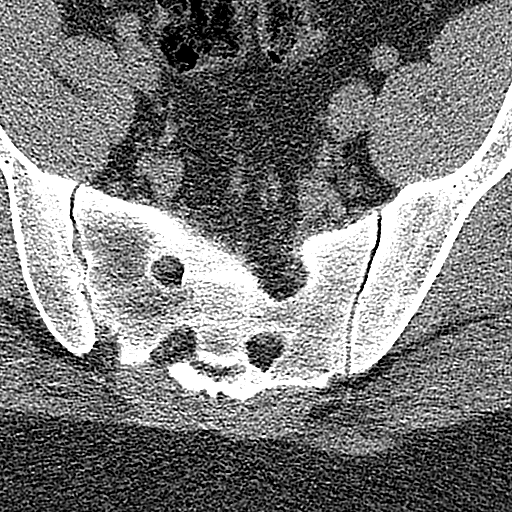
[im 19/121  bone]
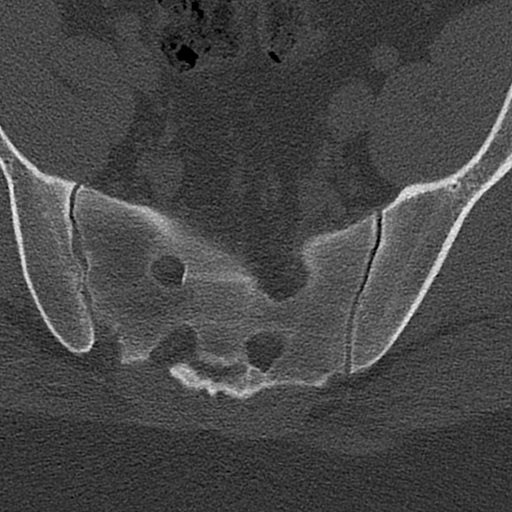
[im 47/121  bone]
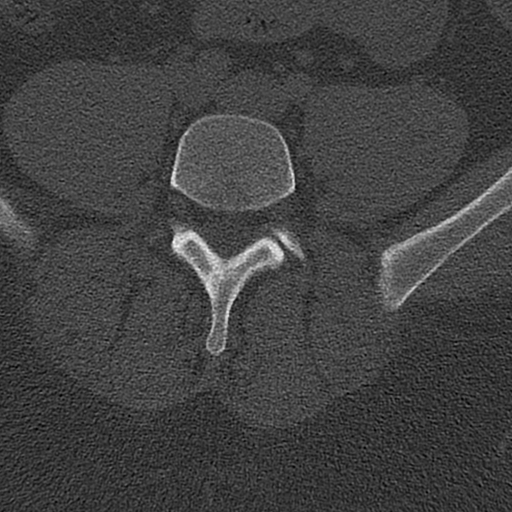
[im 74/121  bone]
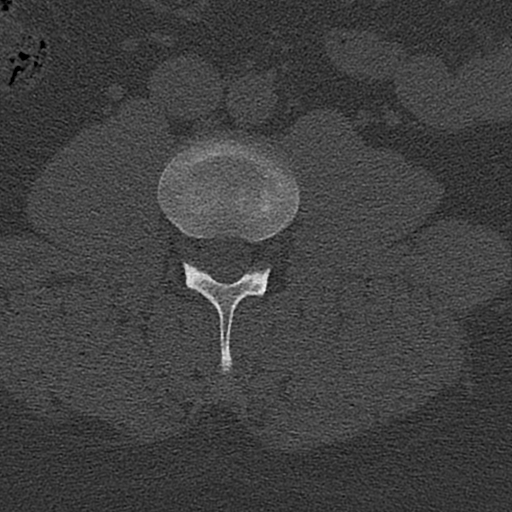
[im 102/121  bone]
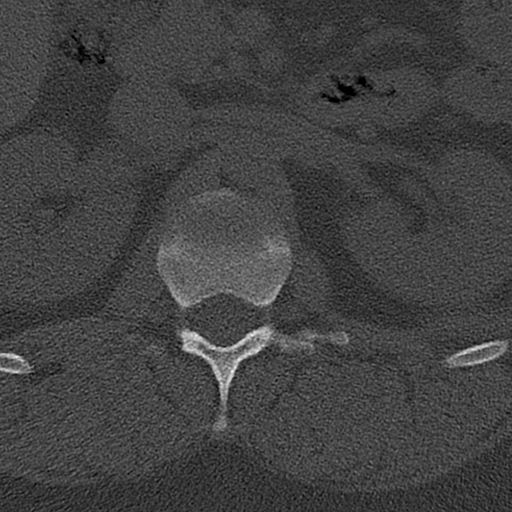

[Series 4: l-spine cor 2.00 br60 s3 · coronal · 0.34mm/px · 3 of 60 slices shown]
[im 12/60  bone]
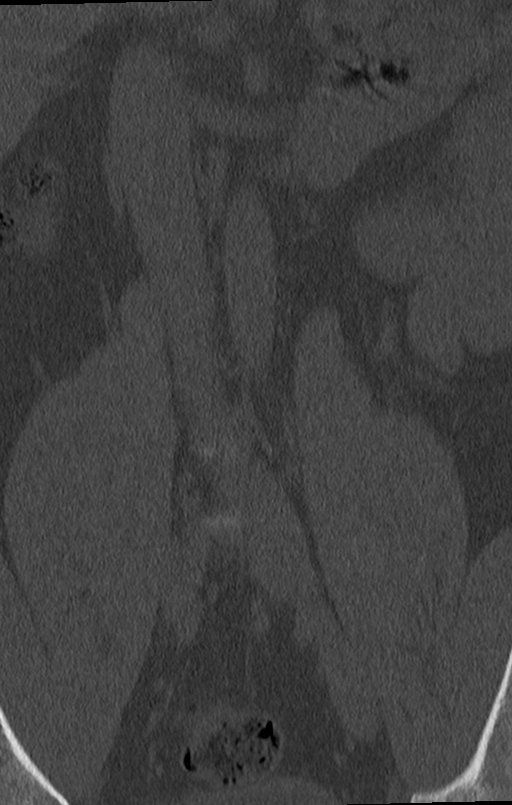
[im 24/60  bone]
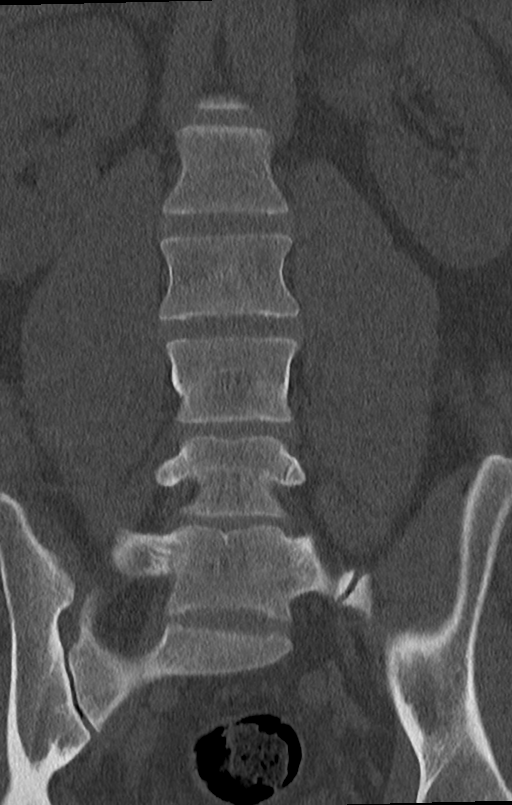
[im 36/60  bone]
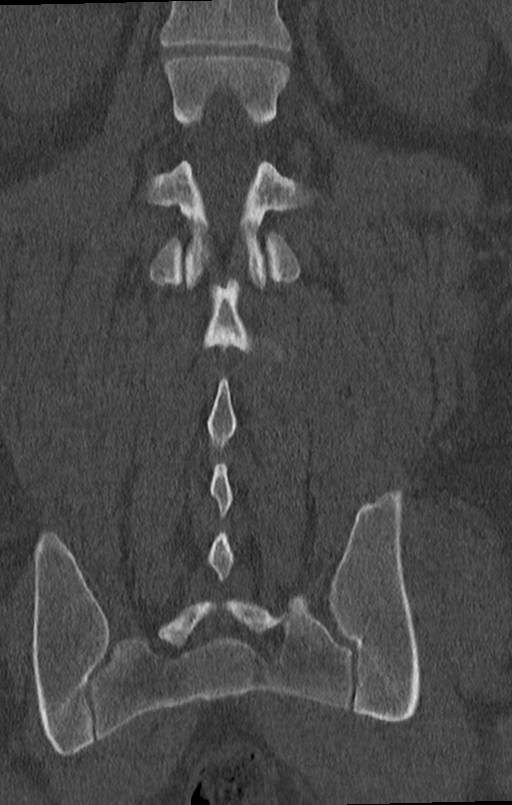

[Series 6: l-spine sag 2.00 br60 s3 · sagittal · 0.34mm/px · 5 of 28 slices shown, 6 images]
[im 10/28  bone]
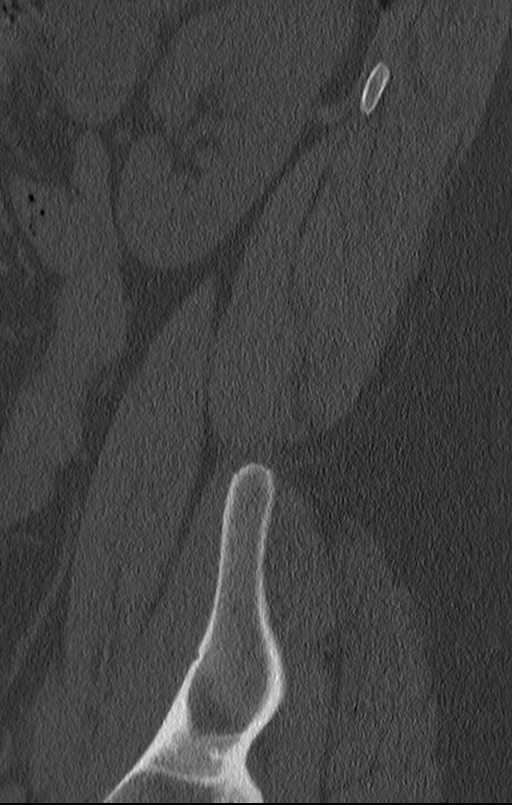
[im 12/28  bone]
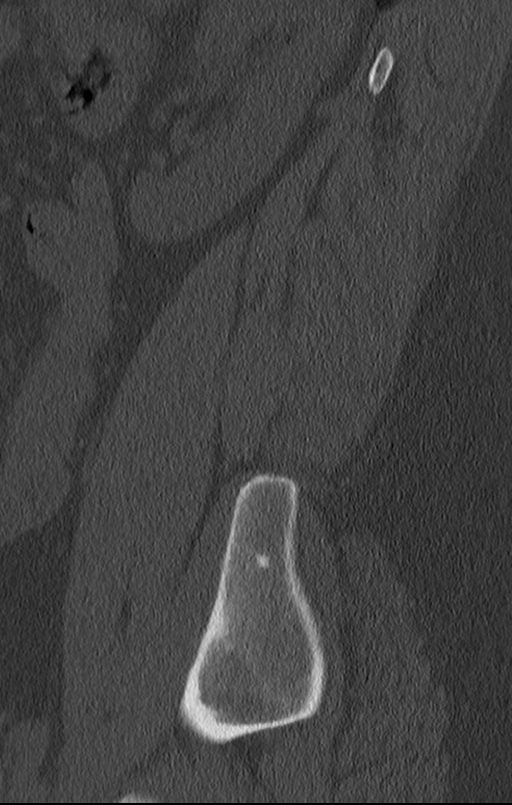
[im 14/28  soft-tissue]
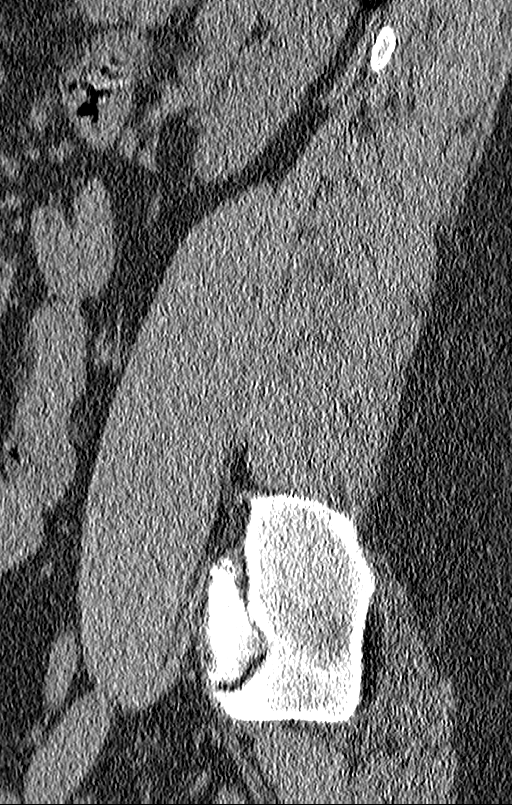
[im 14/28  bone]
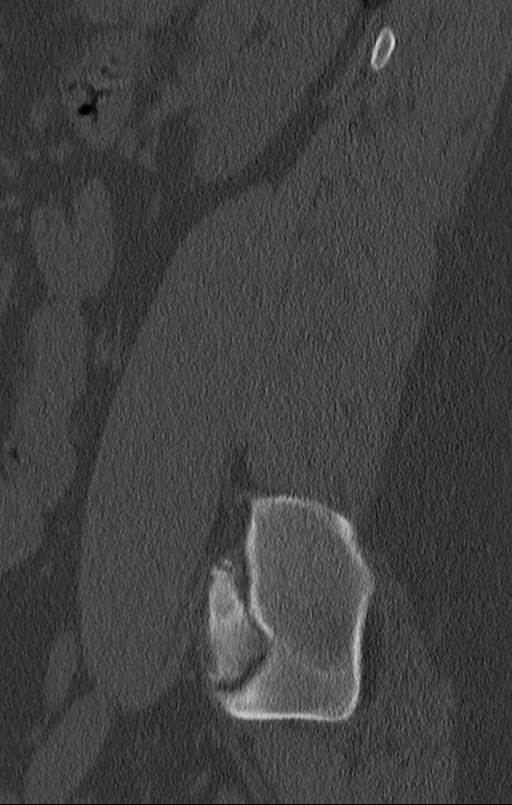
[im 16/28  bone]
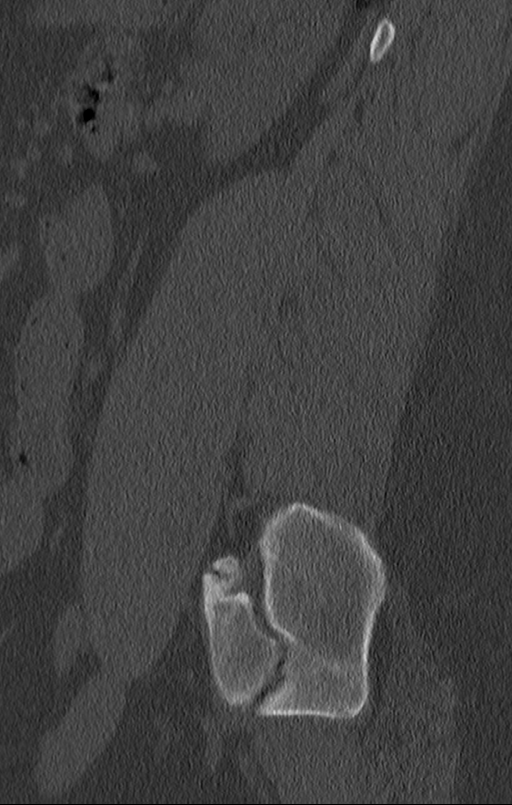
[im 19/28  bone]
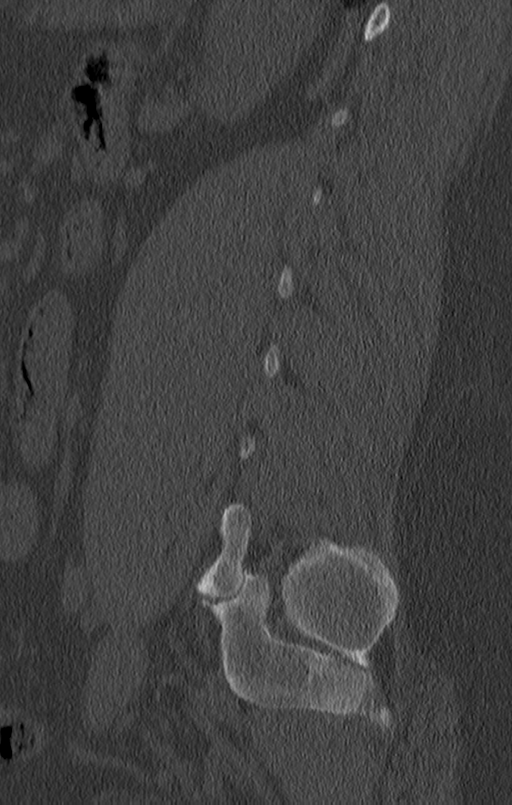

[12 of 33 positions shown; findings below may reference images not displayed]

All CT scans at this facility use dose modulation, interval reconstruction, and/or weight-based
dosing when appropriate to reduce radiation dose to as low as reasonably achievable.

Number of previous computed tomography exams in the last 12 months is 0 .

Number of previous nuclear medicine myocardial perfusion studies in the last 12 months is 0 .
FINDINGS: VERTEBRAE:  Minimal degenerative changes are present in the facets at L5-S1.

NO definite acute compression or posterior element fractures.

SOFT TISSUES:  No definite acute findings.

KIDNEYS AND URETERS:  Minimal nonobstructing intrarenal stone of the RIGHT kidney. NO dilatation of
the renal collecting system.

L1-L2:  NO significant focal or diffuse disc protrusion/focal disc extrusion. There is NO nerve
root impingement or distortion involving the exiting nerve roots.

L2-L3:  NO significant focal or diffuse disc protrusion/focal disc extrusion. There is NO nerve
root impingement or distortion involving the exiting nerve roots.

L3-L4:  Minimal central and LEFT paracentral nonfocal protrusion at L3-L4. NO focal extrusion. The
exiting nerve roots are unremarkable.

L4-L5:  Moderate nonfocal protrusion at L4-L5. The exiting nerve roots are unremarkable. Normal
spinal canal diameter: L4-L5: 17 mm, the current diameter is 1.1 cm.

L5-S1:  Pseudofacet on the LEFT at L5-S1 transverse processes.

Marked degenerative changes and loss of disk height at L5-S1. NO significant focal protrusion or
focal extrusion The exiting nerve roots are unremarkable.
IMPRESSION: - Please refer to the report by individual level for a complete discussion of the findings.

- NO definite acute compression or posterior element fractures.

- Moderate nonfocal protrusion at L4-L5. The exiting nerve roots are unremarkable. Normal spinal
canal diameter: L4-L5: 17 mm, the current diameter is 1.1 cm.

- Minimal nonobstructing intrarenal stone of the RIGHT kidney. NO dilatation of the renal
collecting system.

- Spondylosis.

- 5 lumbar vertebral bodies. Rudimentary ribs at T12.

Tech Notes:

low back pain that radiates down rt leg; no known injury

## 2019-09-16 IMAGING — MR L-spine^Routine
3 series · 8 of 8 positions shown · non-contrast
Comparison: none

[Series 2: T2 · sagittal · 4.0mm · 0.62mm/px · 4 of 4 slices shown (1 of 2)]
[im 1/4]
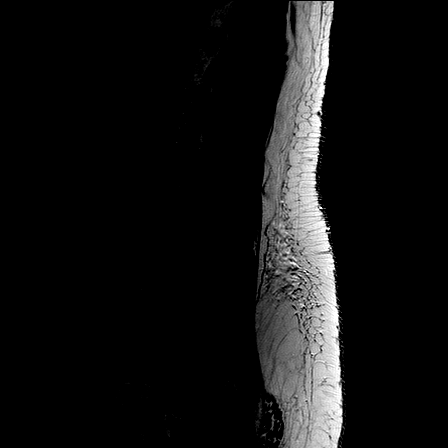
[im 2/4]
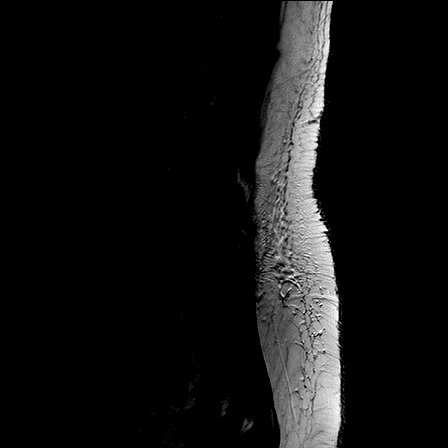
[im 3/4]
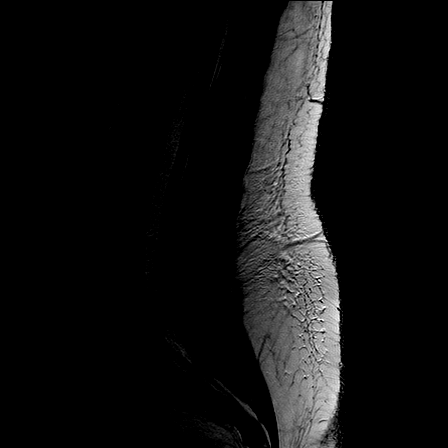
[im 4/4]
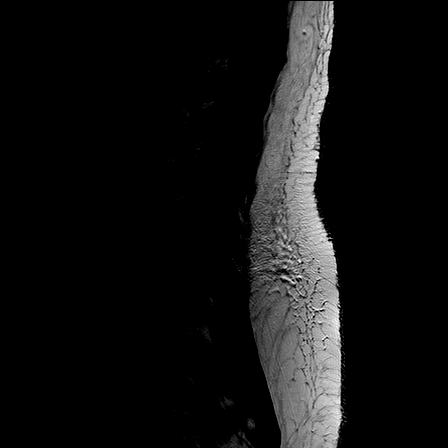

[Series 6: T2 · axial · 4.0mm · 0.49mm/px · 1 of 1 slices shown (2 of 2)]
[im 1/1]
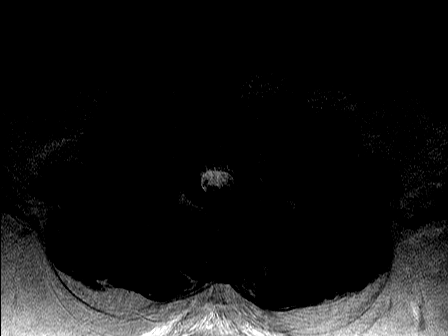

[Series 7: T1 · oblique · 4.0mm · 0.69mm/px · 3 of 3 slices shown]
[im 1/3]
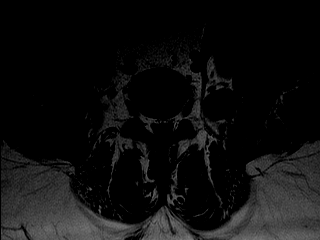
[im 2/3]
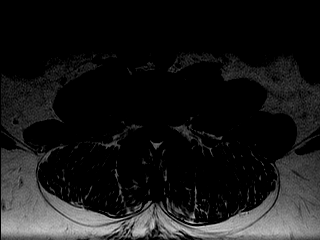
[im 3/3]
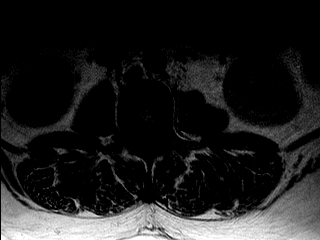

[8 of 8 positions shown; findings below may reference images not displayed]

EXAM

MRI lumbar spine without contrast

INDICATION

Severe Back Pain
LBP, RT LEG PAIN NKI . BKB

FINDINGS

Sagittal T1, T2 and stir and axial T1 and T2 weighted images of the lumbar spine were obtained.

Exam quality is significantly limited by  motion artifact.

Vertebral body heights and alignment appear normal.

At L1-2, there is no disc herniation or nerve root abnormality.

At L2-3, there is no disc herniation or nerve root impingement.

At L3-4, no disc herniation or nerve root impingement is identified. There is a small broad-based
disc bulge.

At L4-5, there is a moderately sized left paracentral disc protrusion, occupying and narrowing the
left lateral recess and displacing the traversing left L5 nerve root. There is also moderate
narrowing of the left neural foramen. There is mild to moderate narrowing of the right neural
foramen due to osteophytes.

At L5-S1, there is facet joint osteoarthrosis. There is no disc herniation.

IMPRESSION

There is a moderately sized left paracentral disc protrusion, occupying and narrowing the left
lateral recess and displacing the traversing left L5 nerve root. There is moderate narrowing of the
neural foramina bilaterally at L4-5 due to osteophytes.

There is a small broad-based disc bulge at L3-4.

Exam quality is limited by motion artifact.

Tech Notes:

LBP, RT LEG PAIN NKI . BKB

## 2019-11-30 ENCOUNTER — Encounter: Admit: 2019-11-30 | Discharge: 2019-11-30 | Payer: Medicaid Other

## 2019-11-30 DIAGNOSIS — G959 Disease of spinal cord, unspecified: Secondary | ICD-10-CM

## 2019-11-30 NOTE — Patient Instructions
It was nice to see you today.  Thank you for choosing to visit our clinic.  Your time is important, and if you had to wait today, we do apologize.  Our goal is to run exactly on time.  However, on occasion, we get behind in clinic due to unexpected patient issues.  Thank you for your patience.    General Instructions:  ? Scheduling:  Our scheduling phone number is [phone].  ? Appointment Reminders on your cell phone:  Make sure we have your cell phone number in your account, and text [place] to [phone].  ? How to reach our office:  Please send a MyChart message to the [facility] or leave a voicemail for the nurse at [phone].  ? How to get a medication refill:  Please use the MyChart Refill request or contact your pharmacy directly to request medication refills.  Please allow 72 business hours for request to be completed.    ? Support for many chronic illnesses is available through [employer] at [email or web address] or [phone].    ? For help with MyChart:  please call [phone].    ? For questions on nights, weekends or holidays:  call the Operator at [phone], and ask for the doctor on call for Neurosurgery.    ? For more information on spinal conditions:  please visit [email or web address]     Again, thank you for coming in today.     Genelle Gather, RN, BSN  Clinical Nurse Coordinator for Dr. Mauricio Po. Clydene Pugh, MD, [facility]  [facility]  Phone [phone]  Fax [phone]

## 2019-12-01 ENCOUNTER — Encounter: Admit: 2019-12-01 | Discharge: 2019-12-01 | Payer: Medicaid Other

## 2019-12-02 ENCOUNTER — Encounter: Admit: 2019-12-02 | Discharge: 2019-12-02 | Payer: Medicaid Other

## 2019-12-02 ENCOUNTER — Ambulatory Visit: Admit: 2019-12-02 | Discharge: 2019-12-02 | Payer: Medicaid Other

## 2019-12-02 DIAGNOSIS — G959 Disease of spinal cord, unspecified: Secondary | ICD-10-CM

## 2019-12-02 DIAGNOSIS — F909 Attention-deficit hyperactivity disorder, unspecified type: Secondary | ICD-10-CM

## 2019-12-02 DIAGNOSIS — F329 Major depressive disorder, single episode, unspecified: Secondary | ICD-10-CM

## 2019-12-02 DIAGNOSIS — M5116 Intervertebral disc disorders with radiculopathy, lumbar region: Secondary | ICD-10-CM

## 2019-12-02 NOTE — Progress Notes
Date of Service: 12/02/2019        Chief complaint    Chief Complaint   Patient presents with   ? Lower Back - Pain   ? New Patient       HPI  Michael Michael is a 29 y.o. male who presents with a history of acute on chronic low back pain and right leg symptoms.  He reports that this started incidentally.  He states that he has pain that starts in his right back radiates into his posterior thigh typically not below the knee.  His pain is worse with activity relieved with rest.  He has tried physical therapy in the past without any significant improvement in symptoms.  He has tried epidural steroid injections with mixed results.  The first 1 did not help, however the second 1 helped about 15 to 20%.  Of note he has a baseline history of chronic urinary incontinence.  He reports that since his back pain started this is gotten worse.    PMH    Medical History:   Diagnosis Date   ? ADHD (attention deficit hyperactivity disorder) N/A    I was very young, i do not remember   ? Depression 04/02/2010           ROS  Review of Systems       FH    No family history on file.  Social History     Socioeconomic History   ? Marital status: Married     Spouse name: Not on file   ? Number of children: Not on file   ? Years of education: Not on file   ? Highest education level: Not on file   Occupational History   ? Not on file   Tobacco Use   ? Smoking status: Current Every Day Smoker     Packs/day: 1.00     Years: 12.00     Pack years: 12.00     Types: Cigarettes   ? Smokeless tobacco: Never Used   Substance and Sexual Activity   ? Alcohol use: Not Currently     Alcohol/week: 1.0 standard drinks     Types: 1 Cans of beer per week   ? Drug use: Not Currently   ? Sexual activity: Not Currently     Partners: Female     Birth control/protection: Condom   Other Topics Concern   ? Not on file   Social History Narrative   ? Not on file           [facility]    No past surgical history on file.    Meds    ? allopurinoL (ZYLOPRIM) 300 mg tablet Take 300 mg by mouth daily.   ? gabapentin (NEURONTIN) 300 mg capsule Take 300 mg by mouth twice daily.   ? oxyCODONE/acetaminophen (PERCOCET) 10/325 mg tablet Take 1 tablet by mouth every 6 hours       Allergies    No Known Allergies      Exam  Physical Exam   Constitutional: he is oriented to person, place, and time. he appears well-developed and well-nourished.    HENT:    Head: Normocephalic and atraumatic.    Nose: Nose normal.    Mouth/Throat: Oropharynx is clear and moist.    Eyes: Conjunctivae and EOM are normal.    Neck: Normal range of motion. Neck supple. No tracheal deviation present. No thyromegaly present.    Pulmonary/Chest: Effort normal. No respiratory distress.   Musculoskeletal: Normal  range of motion. he exhibits no edema or tenderness.   Lymphadenopathy:     he has no cervical adenopathy.   Neurological: he is alert and oriented to person, place, and time. No cranial nerve deficit or sensory deficit. he exhibits normal muscle tone. Coordination normal.   Skin: Skin is warm and dry.   Psychiatric: he has a normal mood and affect. his behavior is normal. Judgment and thought content normal.    Vitals reviewed.  A&Ox3  FS, TML, EOMI      D B Tr Hg IO  R 5/5 5/5 5/5 5/5 5/5  L 5/5 5/5 5/5 5/5 5/5        HF KE DF PF EHL  R  5/5 5/5 5/5 5/5 5/5  L  5/5 5/5 5/5 5/5 5/5  Diminished patellar tendon reflexes bilaterally    Vitals:   Vitals:    12/02/19 1139   BP: 124/88   Pulse: 83   Temp: 37 ?C (98.6 ?F)   SpO2: 98%   Weight: 122.1 kg (269 lb 3.2 oz)   Height: 172.7 cm (68)   PainSc: Six     Body mass index is 40.93 kg/m?.      Imaging:  No results found for this or any previous visit.  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    Plan  Return to see me as needed                              Encounter Medications   Medications   ? gabapentin (NEURONTIN) 300 mg capsule     Sig: Take 300 mg by mouth twice daily.   ? allopurinoL (ZYLOPRIM) 300 mg tablet     Sig: Take 300 mg by mouth daily.   ? oxyCODONE/acetaminophen (PERCOCET) 10/325 mg tablet     Sig: Take 1 tablet by mouth every 6 hours

## 2020-04-19 IMAGING — CR [ID]
2 series · 2 of 2 positions shown · non-contrast
Comparison: none

[knee ap]
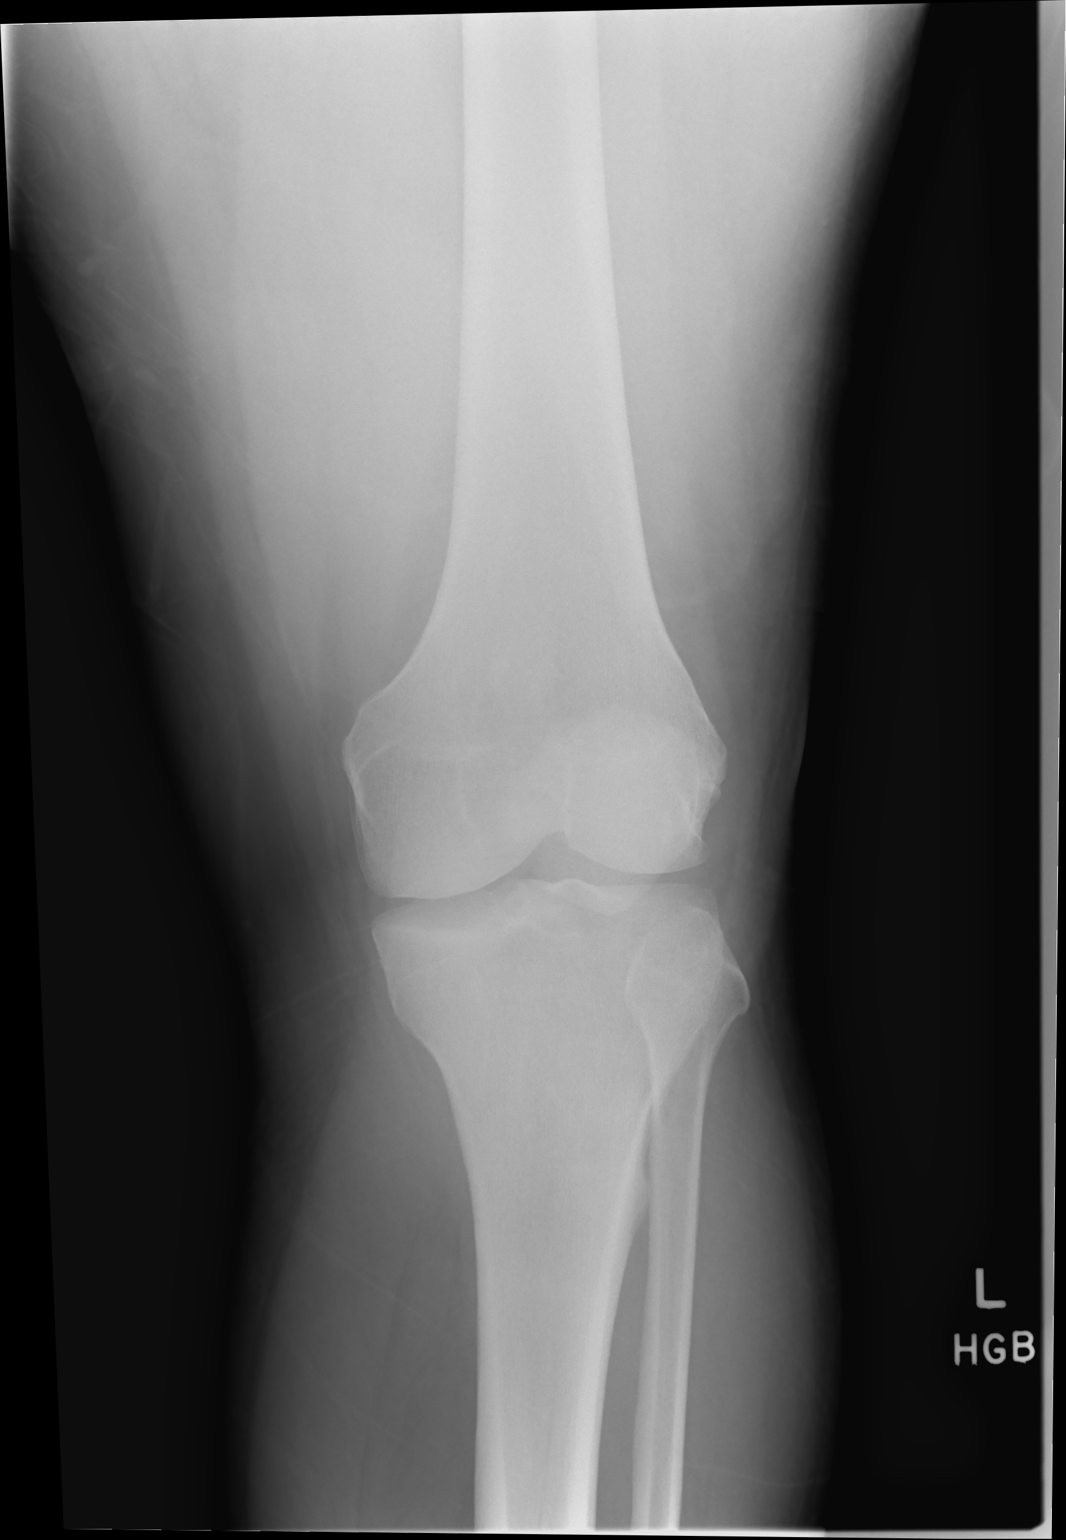

[knee obl]
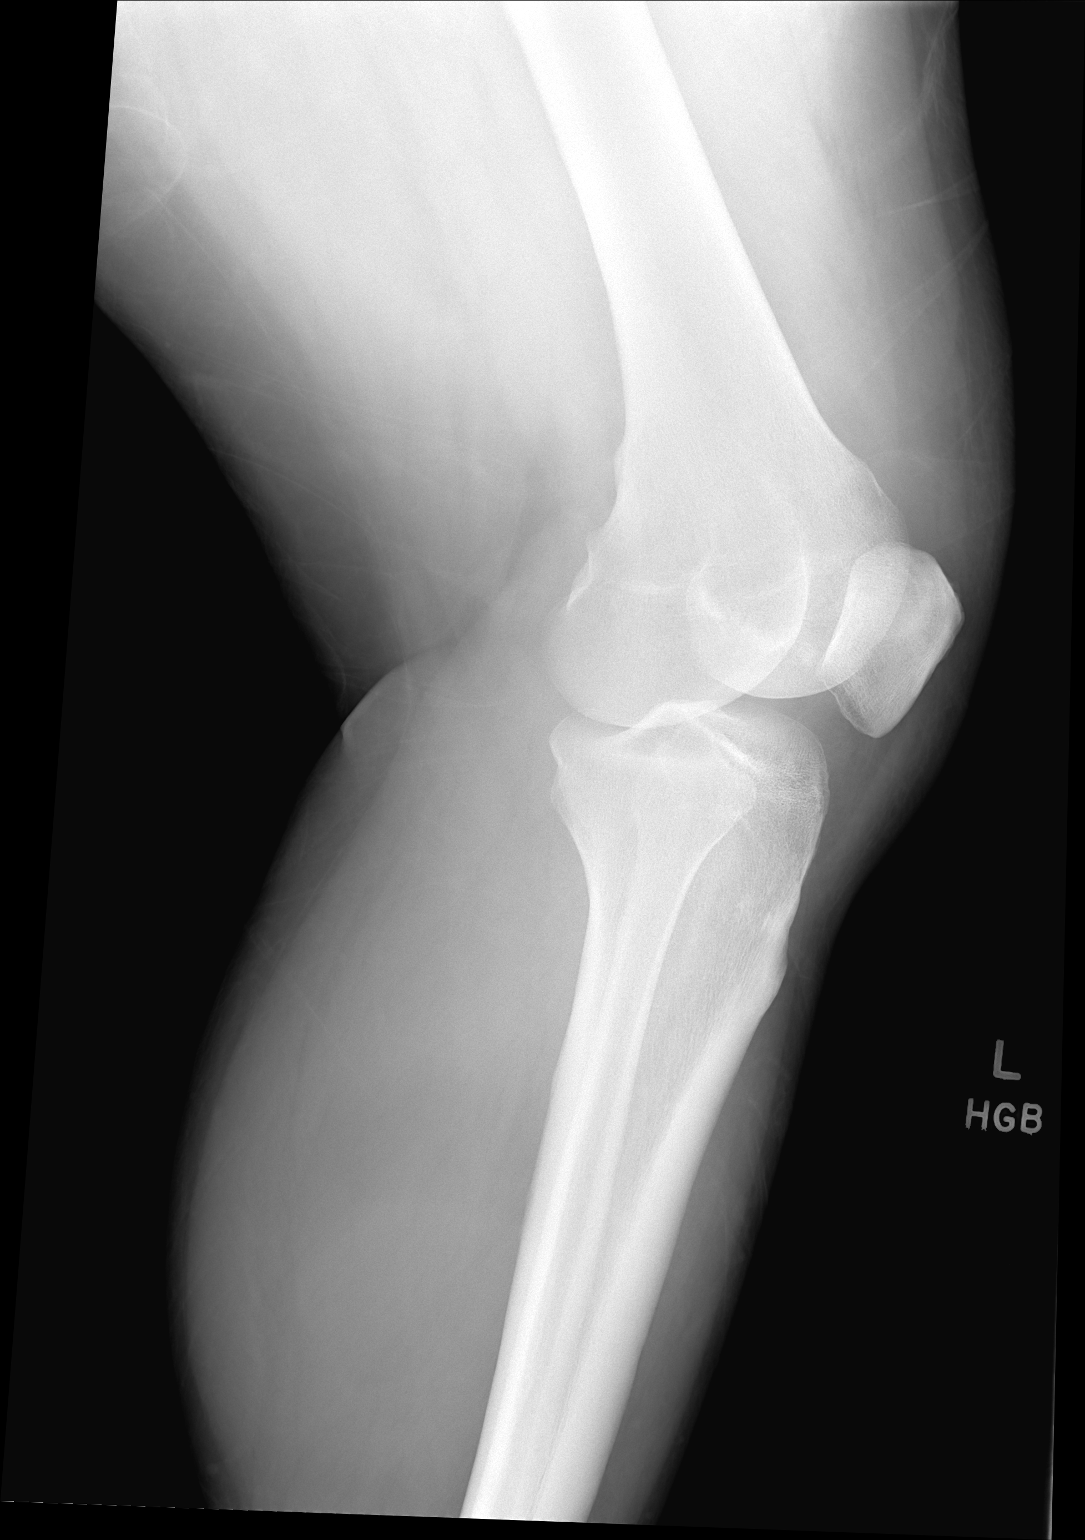

[2 of 2 positions shown; findings below may reference images not displayed]

DIAGNOSTIC STUDIES

EXAM

XR knee LT 3V

INDICATION

pain/swelling
C/O ACUTE ON CHRONIC KNEE PAIN WORSE SINCE LAST NIGHT. NO KNOWN INJURY/TRAUMA.  HB/AC

TECHNIQUE

AP lateral and oblique views of the left knee

COMPARISONS

None available

FINDINGS

No fractures or dislocations are seen. Joint spaces are well maintained. Small knee effusion is
evident.

IMPRESSION

Small knee effusion. No fractures are seen.

Tech Notes:

C/O ACUTE ON CHRONIC KNEE PAIN WORSE SINCE LAST NIGHT. NO KNOWN INJURY/TRAUMA.
HB/AC

## 2020-04-26 IMAGING — MR Knee^Routine
7 series · 38 of 40 positions shown · non-contrast
Comparison: none

[Series 3: T2 fat-sat · axial · 4.0mm · 0.50mm/px · z∈[-60,+46]mm · 5 of 25 slices shown (1 of 3)]
[im 1/25]
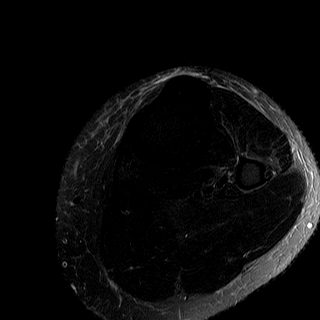
[im 7/25]
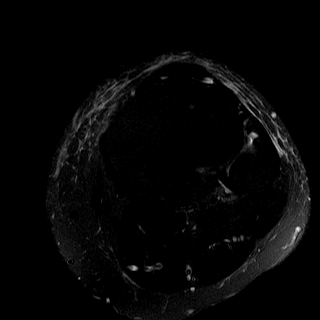
[im 13/25]
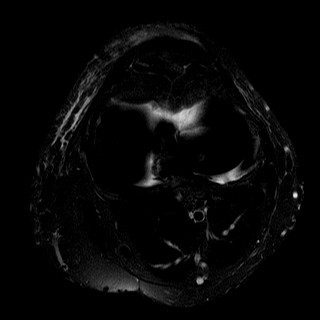
[im 19/25]
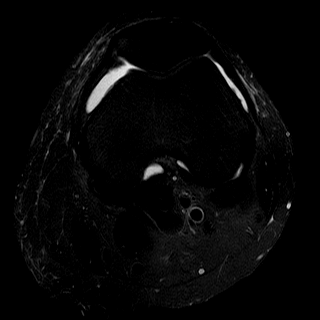
[im 25/25]
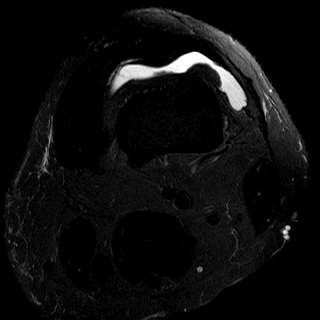

[Series 4: T1 · axial · 4.0mm · 0.62mm/px · z∈[-59,+46]mm · 6 of 25 slices shown (1 of 2)]
[im 1/25]
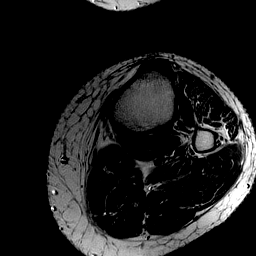
[im 5/25]
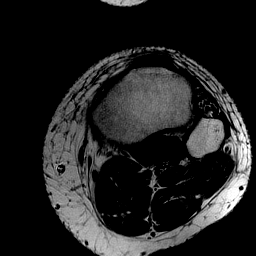
[im 10/25]
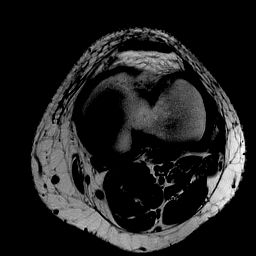
[im 15/25]
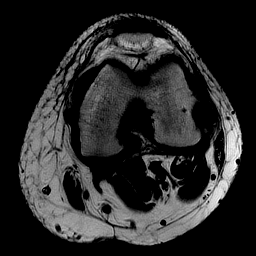
[im 20/25]
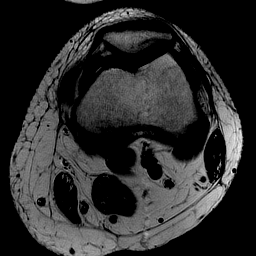
[im 25/25]
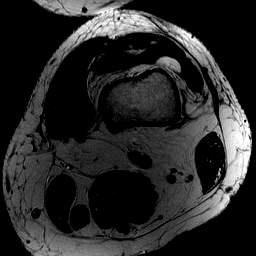

[Series 5: T2 fat-sat · oblique · 3.5mm · 0.50mm/px · 6 of 27 slices shown (2 of 3)]
[im 1/27]
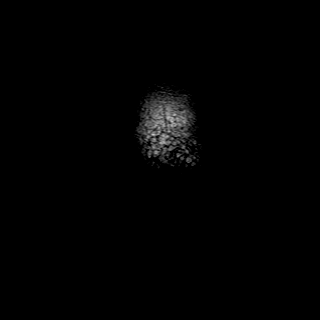
[im 6/27]
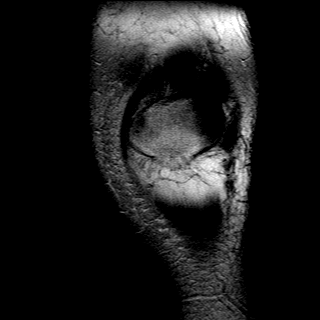
[im 11/27]
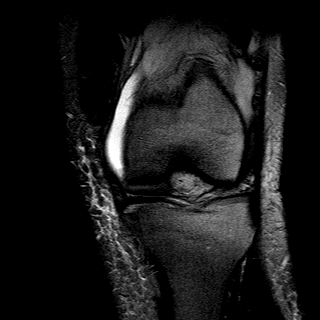
[im 16/27]
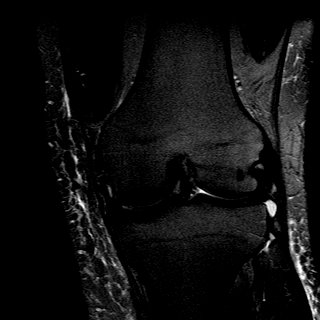
[im 21/27]
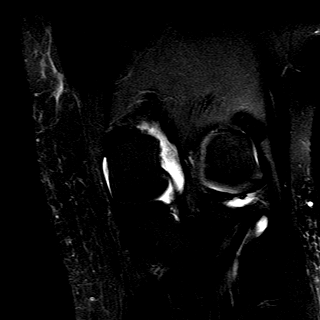
[im 27/27]
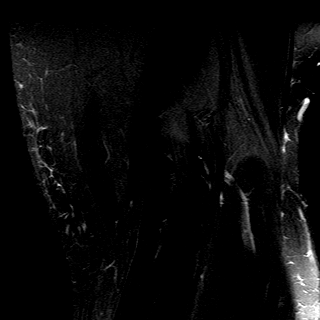

[Series 6: T2 fat-sat · sagittal · 4.0mm · 0.50mm/px · 6 of 25 slices shown (3 of 3)]
[im 1/25]
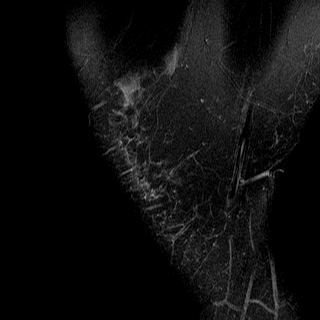
[im 5/25]
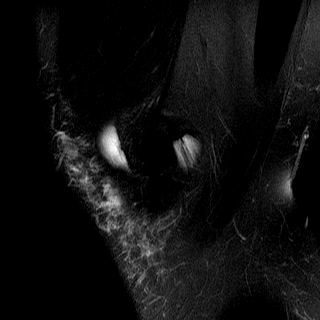
[im 10/25]
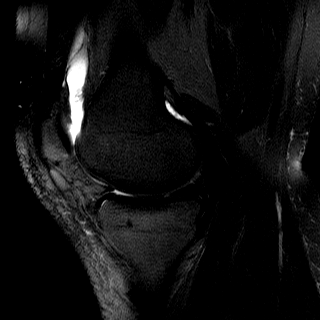
[im 15/25]
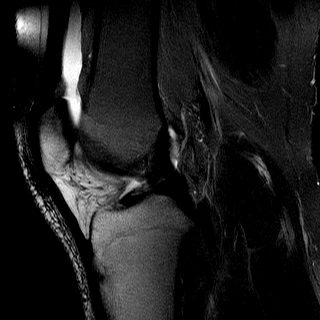
[im 20/25]
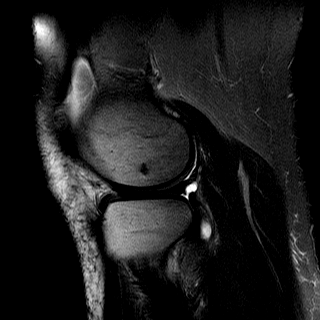
[im 25/25]
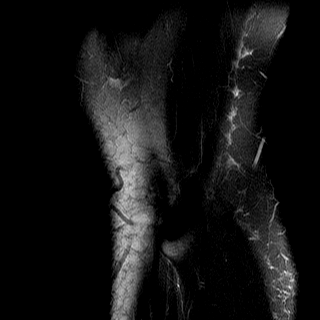

[Series 7: PD · sagittal · 4.0mm · 0.29mm/px · 6 of 25 slices shown]
[im 1/25]
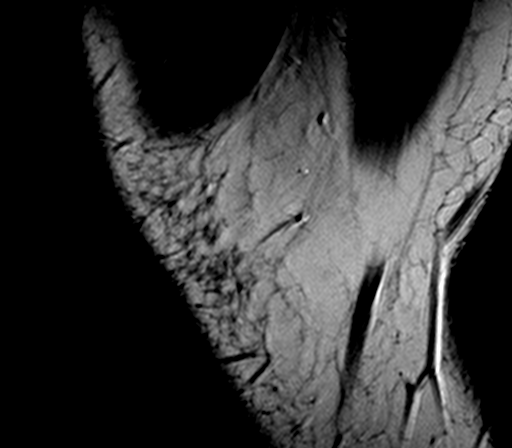
[im 5/25]
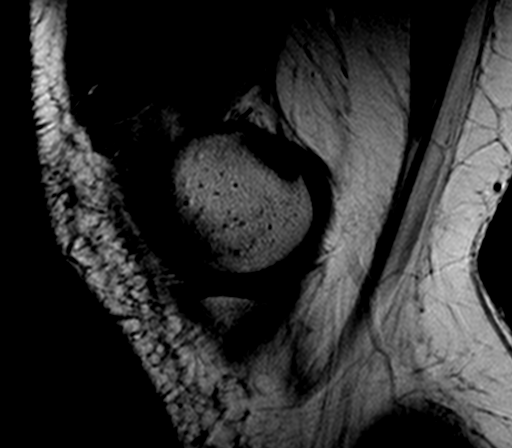
[im 10/25]
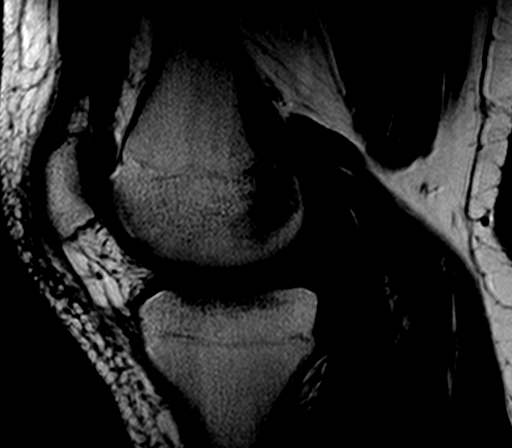
[im 15/25]
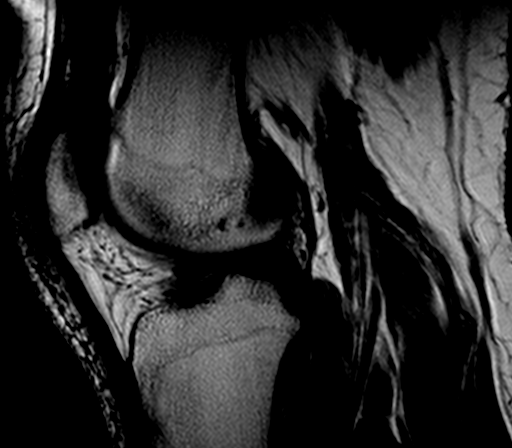
[im 20/25]
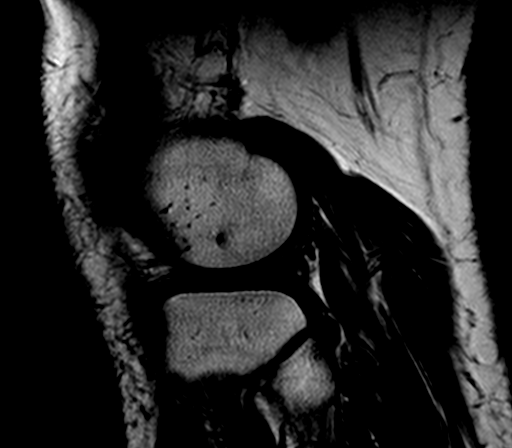
[im 25/25]
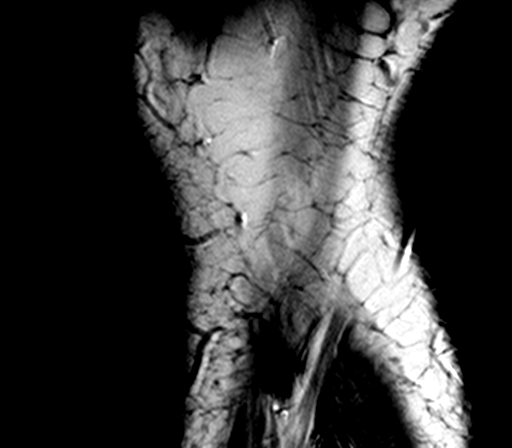

[Series 8: T1 · coronal · 4.0mm · 0.62mm/px · 6 of 25 slices shown (2 of 2)]
[im 1/25]
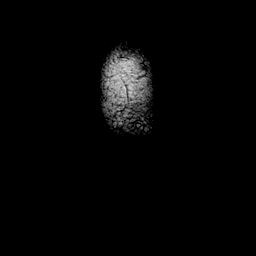
[im 5/25]
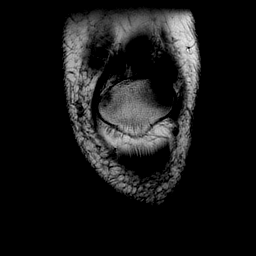
[im 10/25]
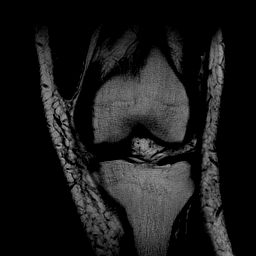
[im 15/25]
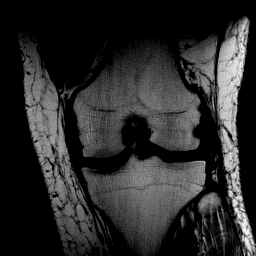
[im 20/25]
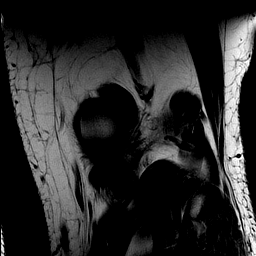
[im 25/25]
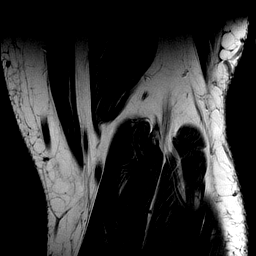

[Series 9: STIR · sagittal · 4.0mm · 0.29mm/px · 3 of 22 slices shown]
[im 1/22]
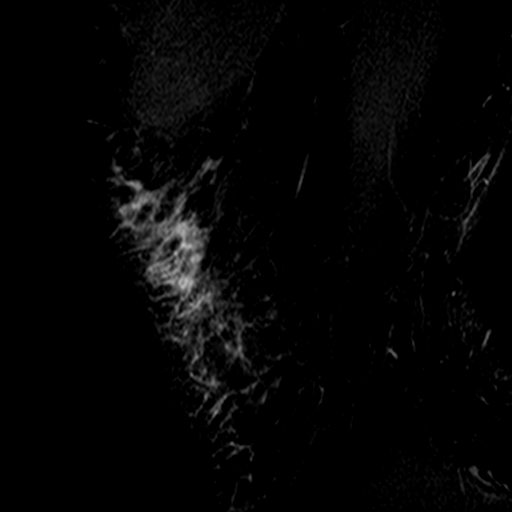
[im 6/22]
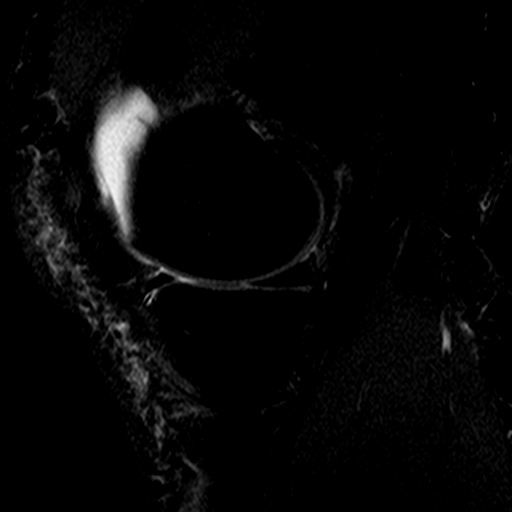
[im 11/22]
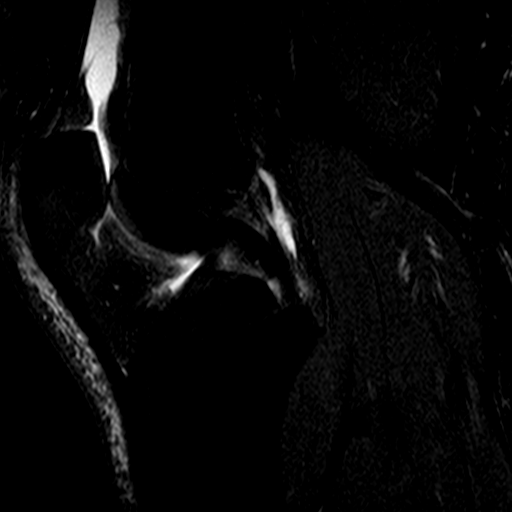

[38 of 40 positions shown; findings below may reference images not displayed]

DIAGNOSTIC STUDIES

EXAM

MRI of the left knee without contrast.

INDICATION

lt knee pain, effusion, severe pain
KNEE PAIN, SHOOTING PAIN FROM BOTH SIDES WITH POPPING SOUND.  RG

TECHNIQUE

Sagittal axial and coronal images were obtained with variable T1 and T2 weighting.

COMPARISONS

None available

FINDINGS

Quadriceps and patellar tendons are normal. Mild edema is noted in the prepatellar soft tissues.
The articular cartilage of the patellofemoral joint space is well preserved. Small knee effusion is
noted with suprapatellar plica. There is mild edema and irregularity of Hoffa's fat pad which may
indicate synovitis.

The anterior and posterior cruciate ligaments are normal.

The medial and lateral collateral ligaments are within normal limits.

Minimal fluid can be seen along the popliteus tendon.

The lateral meniscus is unremarkable.

The medial meniscus is unremarkable.

Normal marrow signal properties are noted throughout the visualized osseous structures.

IMPRESSION

Knee effusion with suprapatellar plica. Edema in Hoffa's fat pad may indicate synovitis.

Minimal fluid along the popliteus tendon.

Mild edema in the prepatellar soft tissues.

Tech Notes:

KNEE PAIN, SHOOTING PAIN FROM BOTH SIDES WITH POPPING SOUND.  RG

## 2020-05-13 IMAGING — CR [ID]
2 series · 2 of 2 positions shown · non-contrast
Comparison: none

[lspine obl]
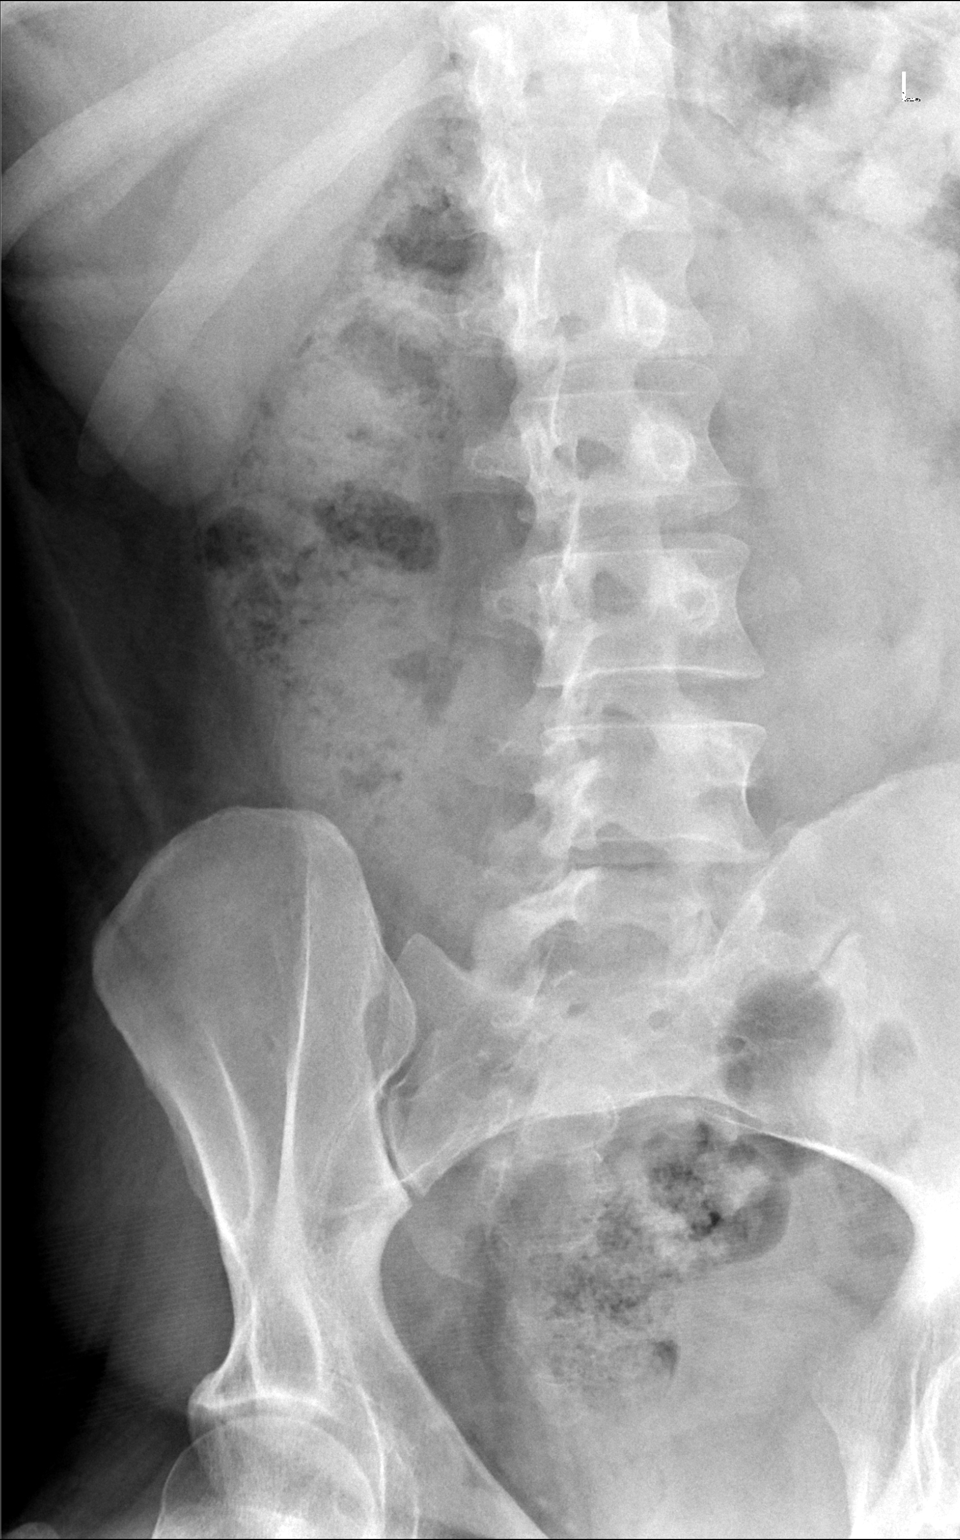

[lspine l5-s1]
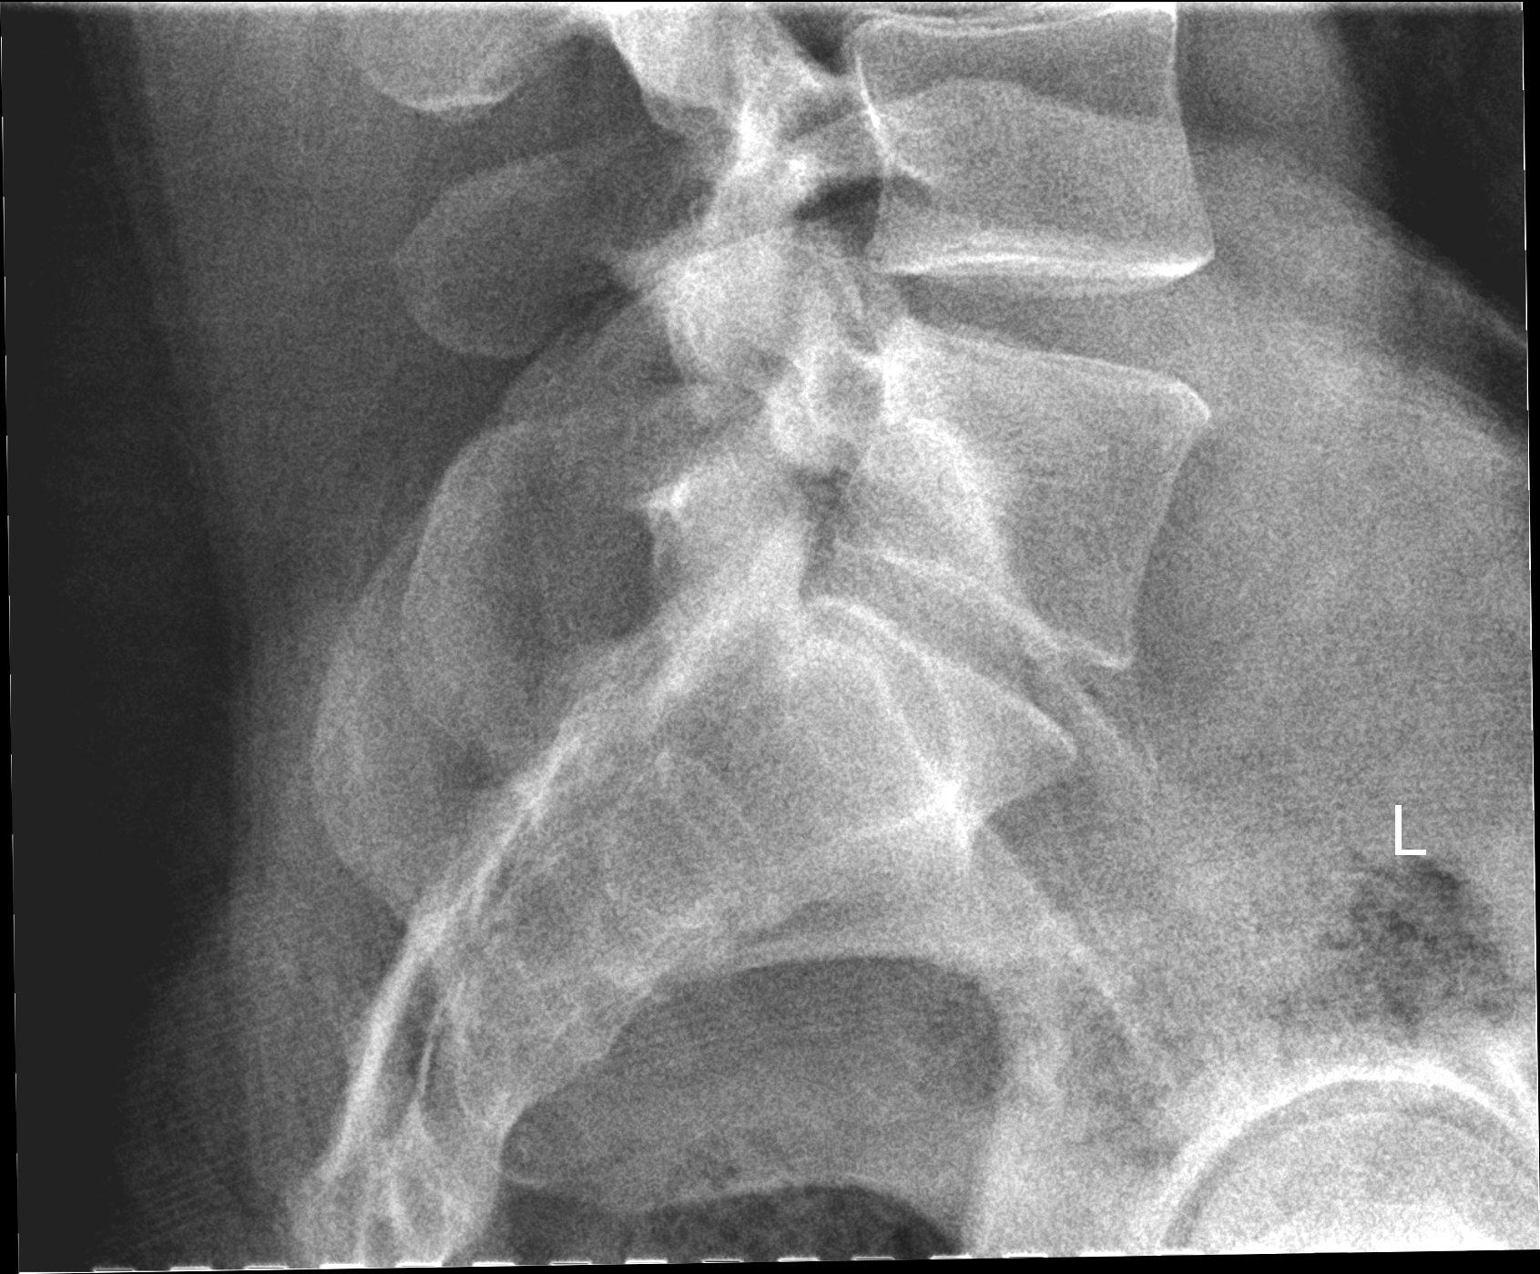

[2 of 2 positions shown; findings below may reference images not displayed]

DIAGNOSTIC STUDIES

EXAM

XR lumbar spine 5 view

INDICATION

low back pain
Pt c/o lower back pain x today. Pt states ruptured disc x 8 months ago. BM

TECHNIQUE

COMPARISONS

08/27/2019

FINDINGS

The lumbar spine is normally aligned. Vertebral bodies are normal height. Normal intervertebral
disc space heights. No listhesis.

IMPRESSION

No acute compression deformity

Tech Notes:

Pt c/o lower back pain x today. Pt states ruptured disc x 8 months ago. BM

## 2020-06-09 ENCOUNTER — Encounter: Admit: 2020-06-09 | Discharge: 2020-06-09 | Payer: Medicaid Other

## 2020-06-09 NOTE — Patient Instructions
It was nice to see you today.  Thank you for choosing to visit our clinic.  Your time is important, and if you had to wait today, we do apologize.  Our goal is to run exactly on time.  However, on occasion, we get behind in clinic due to unexpected patient issues.  Thank you for your patience.    General Instructions:   Scheduling:  Our scheduling phone number is [phone].   Appointment Reminders on your cell phone:  Make sure we have your cell phone number in your account, and text [place] to [phone].   How to reach our office:  Please send a MyChart message to the [facility] or leave a voicemail for the nurse Anye Brose, RN, BSN at [phone].   How to get a medication refill:  Please use the MyChart Refill request or contact your pharmacy directly to request medication refills.  Please allow 72 business hours for request to be completed.     Support for many chronic illnesses is available through [facility] at [email or web address] or [phone].     For help with MyChart:  please call [phone].     For questions on nights, weekends or holidays:  call the Operator at [phone], and ask for the doctor on call for Neurosurgery.     For more information on spinal conditions:  please visit [email or web address]    Our office fax number is [phone]    Again, thank you for coming in today.     Jaedan Schuman, RN, BSN  Clinical Nurse Coordinator for Dr. Ifije Ohiorhenuan  Marc A. Asher, MD, [facility]  [facility]  Phone [phone]  Fax [phone]

## 2020-06-13 ENCOUNTER — Encounter: Admit: 2020-06-13 | Discharge: 2020-06-13 | Payer: Medicaid Other

## 2020-06-15 ENCOUNTER — Ambulatory Visit: Admit: 2020-06-15 | Discharge: 2020-06-15 | Payer: Medicaid Other

## 2020-06-15 ENCOUNTER — Encounter: Admit: 2020-06-15 | Discharge: 2020-06-15 | Payer: Medicaid Other

## 2020-06-15 DIAGNOSIS — F32A Depression: Secondary | ICD-10-CM

## 2020-06-15 DIAGNOSIS — F909 Attention-deficit hyperactivity disorder, unspecified type: Secondary | ICD-10-CM

## 2020-06-15 DIAGNOSIS — M5116 Intervertebral disc disorders with radiculopathy, lumbar region: Secondary | ICD-10-CM

## 2020-06-15 NOTE — Progress Notes
Date of Service: 06/15/2020        Chief complaint    Chief Complaint   Patient presents with   ? Lower Back - Pain   ? Follow Up     BACK PAIN       HPI  Michael Michael is a 30 y.o. male who presents with a history of low back pain and left-sided leg pain.  He reports waxing and waning symptoms.  For the most part he is doing very well.  Occasionally has some severe low back pain which sends him to the hospital.  He recently underwent an MRI of his lumbar spine.  And is here today to discuss those results.  Currently he does not have any significant low back pain at this time.  He reports that he is done relatively well after injections.    PMH    Medical History:   Diagnosis Date   ? ADHD (attention deficit hyperactivity disorder) N/A    I was very young, i do not remember   ? Depression 04/02/2010           ROS  Review of Systems   HENT: Positive for dental problem.    Genitourinary: Positive for difficulty urinating.   Musculoskeletal: Positive for back pain.   Neurological: Positive for headaches.   All other systems reviewed and are negative.         FH    Family History   Problem Relation Age of Onset   ? Back pain Father         Mother and relatives on mother side all had some kind of back pain issues at young age     Social History     Socioeconomic History   ? Marital status: Married     Spouse name: Not on file   ? Number of children: Not on file   ? Years of education: Not on file   ? Highest education level: Not on file   Occupational History   ? Not on file   Tobacco Use   ? Smoking status: Current Every Day Smoker     Packs/day: 1.00     Years: 12.00     Pack years: 12.00     Types: Cigarettes   ? Smokeless tobacco: Never Used   Substance and Sexual Activity   ? Alcohol use: Not Currently     Alcohol/week: 1.0 standard drink     Types: 1 Cans of beer per week   ? Drug use: Not Currently   ? Sexual activity: Not Currently     Partners: Female     Birth control/protection: Condom   Other Topics Concern   ? Not on file   Social History Narrative   ? Not on file           [facility]    No past surgical history on file.    Meds    ? allopurinoL (ZYLOPRIM) 300 mg tablet Take 300 mg by mouth daily.   ? gabapentin (NEURONTIN) 300 mg capsule Take 300 mg by mouth twice daily.   ? oxyCODONE/acetaminophen (PERCOCET) 10/325 mg tablet Take 1 tablet by mouth every 6 hours       Allergies    No Known Allergies      Exam  Physical Exam   Awake alert  Moves all extremities strong symmetrically    Vitals:   Vitals:    06/15/20 1528   BP: 114/67   BP Source: Arm,  Right Upper   Patient Position: Sitting   Pulse: 73   Resp: 18   SpO2: 94%   Weight: 127.9 kg (282 lb)   Height: 172.7 cm (5' 8)   PainSc: Six     Body mass index is 42.88 kg/m?.      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

## 2020-09-15 IMAGING — CT ABDOMEN_PELVIS WO(Adult)
2 of 3 series · 13 of 46 positions shown, 15 images · non-contrast
Comparison: none

[Series 2: abdomen_pelvis ax 3.00 br40 s3 · axial · 0.71mm/px · z∈[+1474,+1918]mm · 10 of 170 slices shown, 12 images]
[im 11/170  soft-tissue]
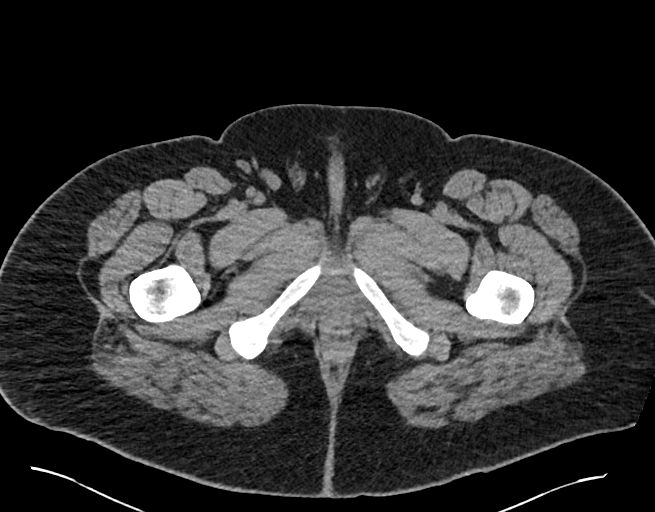
[im 11/170  bone]
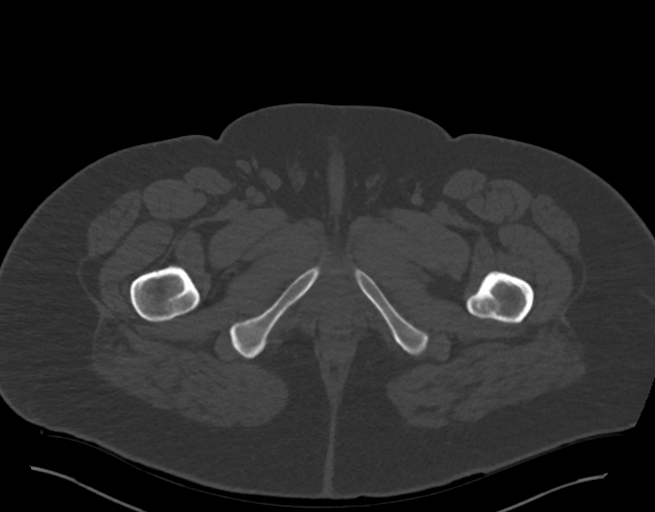
[im 28/170  soft-tissue]
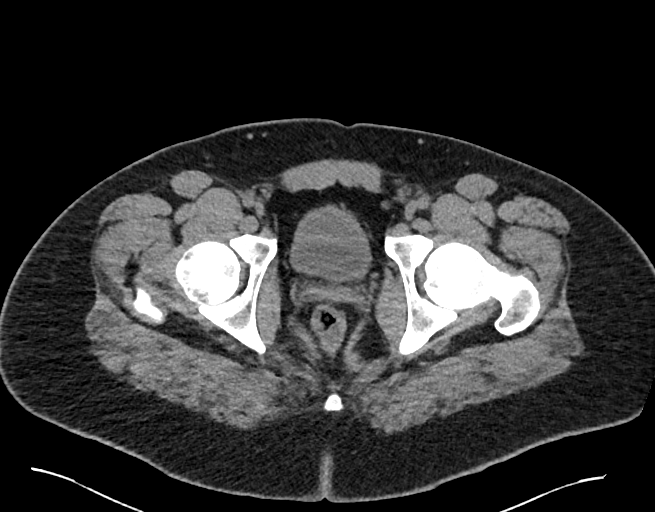
[im 44/170  soft-tissue]
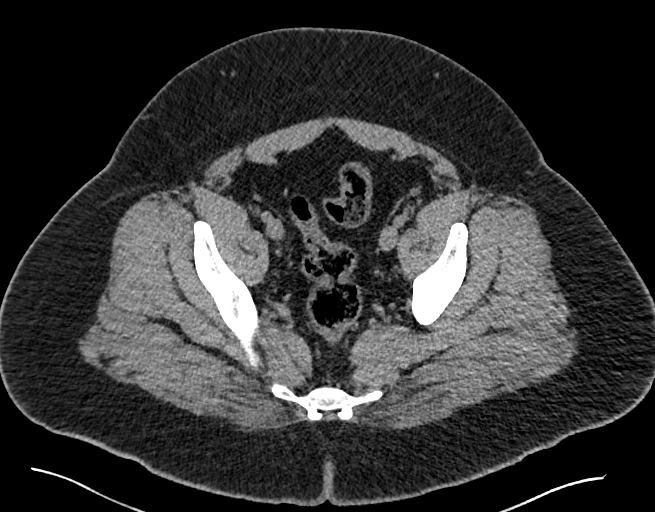
[im 60/170  soft-tissue]
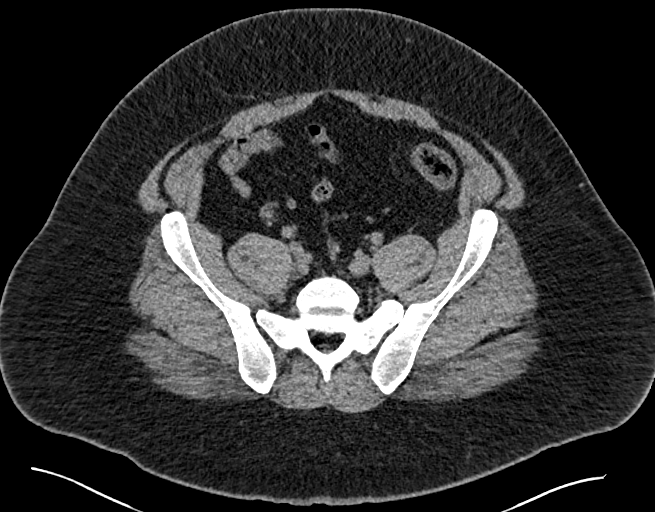
[im 77/170  soft-tissue]
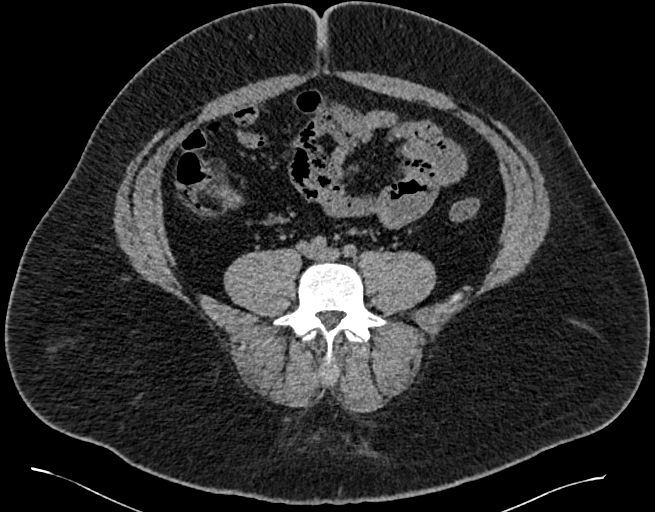
[im 93/170  soft-tissue]
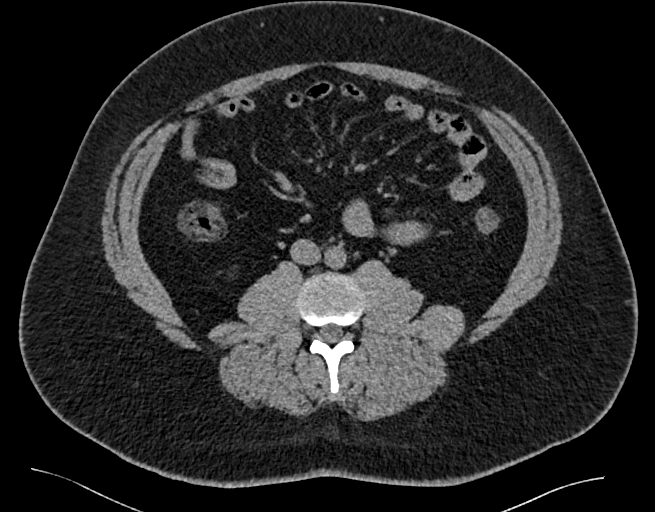
[im 110/170  soft-tissue]
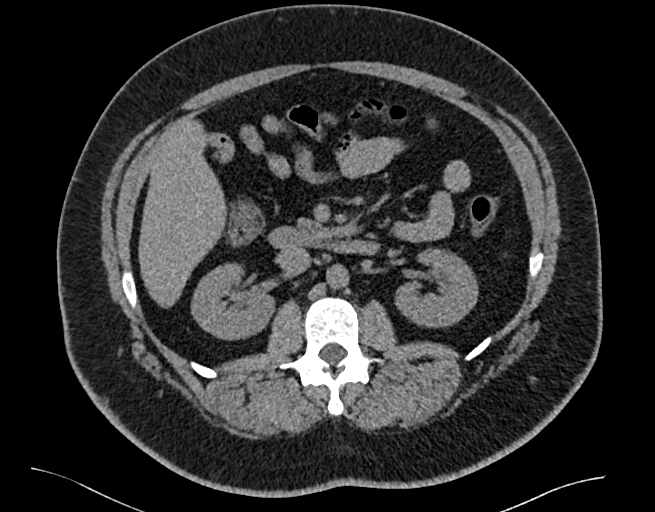
[im 126/170  soft-tissue]
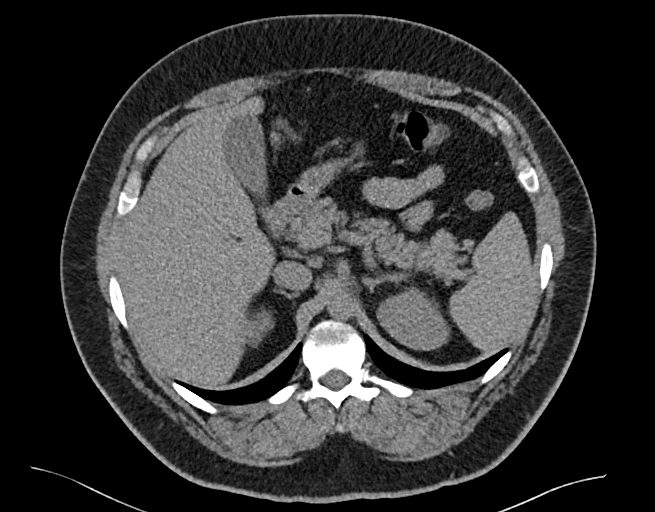
[im 142/170  soft-tissue]
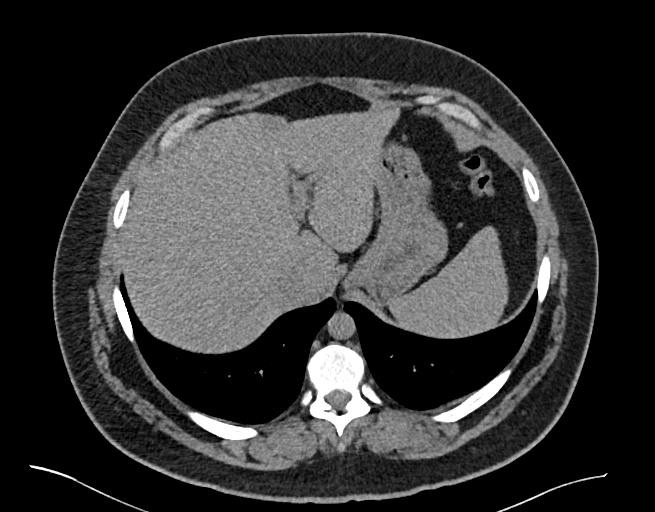
[im 142/170  bone]
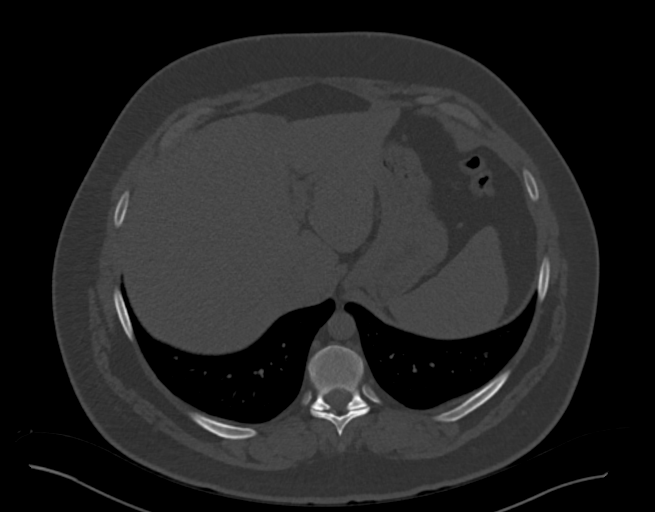
[im 159/170  soft-tissue]
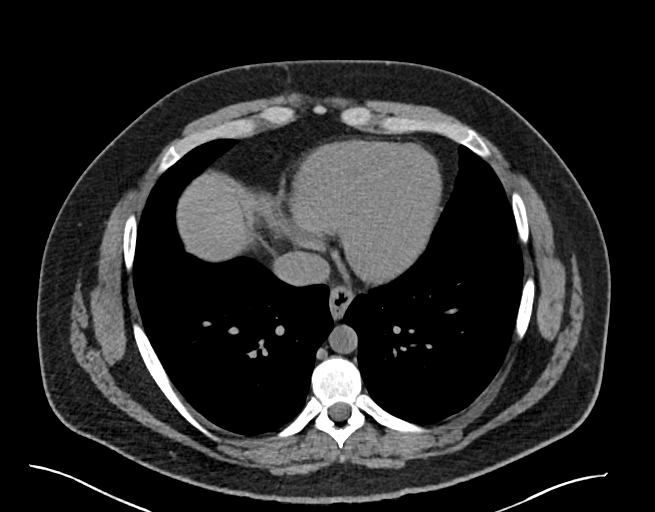

[Series 4: abdomen_pelvis cor 3.00 br40 s3 · coronal · 0.91mm/px · 3 of 121 slices shown]
[im 41/121  soft-tissue]
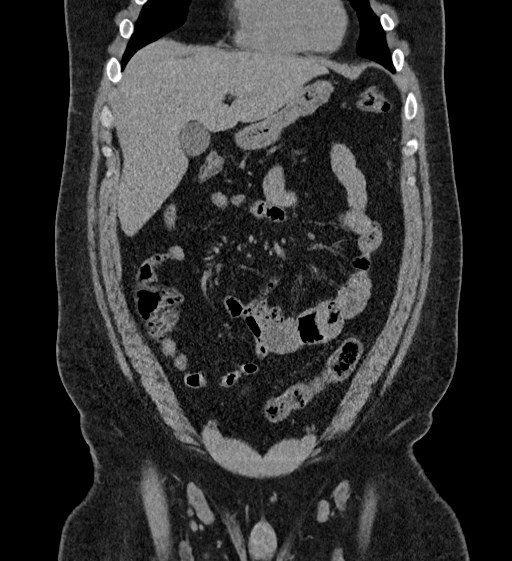
[im 54/121  soft-tissue]
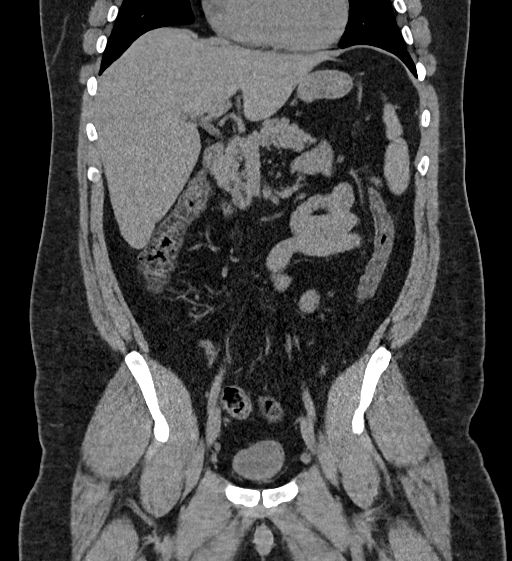
[im 67/121  soft-tissue]
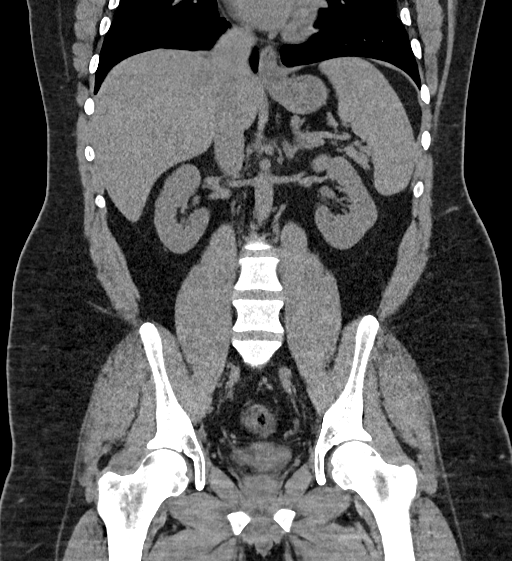

[13 of 46 positions shown; findings below may reference images not displayed]

DIAGNOSTIC STUDIES

EXAM

CT of the abdomen and pelvis without contrast

INDICATION

ruq pain with mass, change in bowel habit
C/O RUQ PAIN W/PALPABLE TENDER LUMPS OVER LOWER CHEST/UPPER ABD. CT/NM 1/0    HB

TECHNIQUE

All CT scans at this facility use dose modulation, iterative reconstruction, and/or weight based
dosing when appropriate to reduce radiation dose to as low as reasonably achievable.

Number of previous computed tomography exams in the last 12 months is 1.

Number of previous nuclear medicine myocardial perfusion studies in the last 12 months is 0  .

COMPARISONS

None

FINDINGS

Lung bases: No focal consolidation in the lung bases.

Liver: Low-density along the ligamentum teres likely representing 3rd inflow.

Gallbladder: Normal appearing.

Bile ducts: No intra or extrahepatic biliary ductal dilation.

Spleen: Normal appearing.

Pancreas: Normal appearing.

Adrenals: Normal appearing.

Kidneys: Punctate nonobstructive 2 millimeter right renal lower pole calculus. No ureteral calculi.
No hydronephrosis.

Vasculature: Abdominal aorta is normal in caliber.

Bowel: No dilated loops of bowel. No obstruction. No inflammatory changes of the appendix.

Free fluid: None.

Lymph nodes: Nonspecific bilateral inguinal nodes.

Bladder: Nondistended.

Osseous structures: No acute osseous findings. Partial sacralization of L5 on the left.

IMPRESSION

No acute findings in the abdomen or pelvis.

Punctate nonobstructive right renal lower pole calculus.

Tech Notes:

C/O RUQ PAIN W/PALPABLE TENDER LUMPS OVER LOWER CHEST/UPPER ABD. CT/NM 1/0

HB

## 2020-10-18 IMAGING — CR [ID]
1 series · 1 of 1 positions shown · non-contrast
Comparison: None

PROCEDURE: [id number]
HISTORY: Shortness of breath and states that he tested positive for COVID 2 days ago. He is a
current
smoker and this is the second time being diagnosed with COVID per patient. TB

[x chest ap]
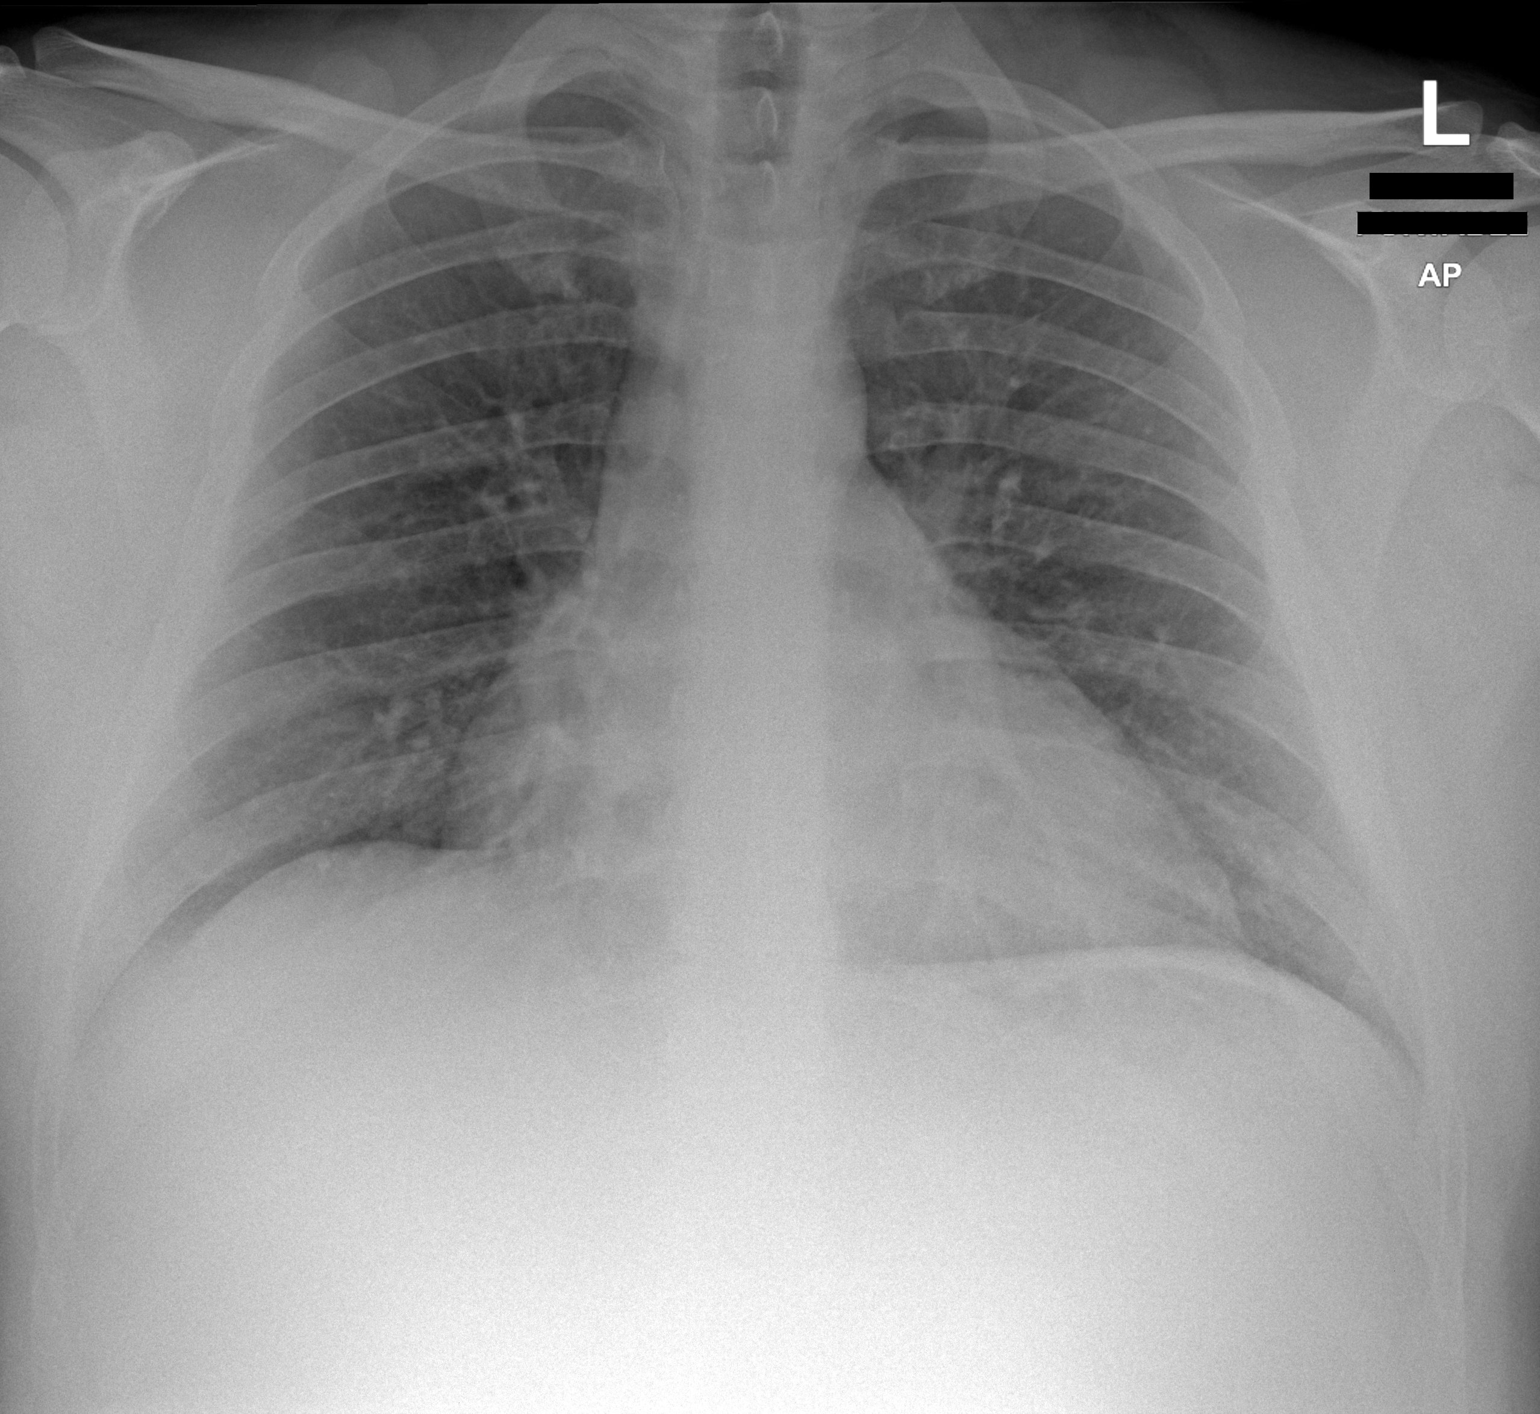

[1 of 1 positions shown; findings below may reference images not displayed]

FINDINGS: The cardiomediastinal silhouette is within normal limits.
The trachea is midline.
No pulmonary consolidations or masses.
No evidence of pneumothorax.
The imaged abdomen is within normal limits.
The osseous and soft tissue structures demonstrate no acute findings.
IMPRESSION: 1. No acute chest process.

Tech Notes:

Shortness of breath and states that he tested positive for COVID 2 days ago. He is a current smoker
and this is the second time being diagnosed with COVID per patient. TB

## 2020-12-12 ENCOUNTER — Ambulatory Visit: Admit: 2020-12-12 | Discharge: 2020-12-12 | Payer: Medicaid Other

## 2020-12-12 ENCOUNTER — Encounter: Admit: 2020-12-12 | Discharge: 2020-12-12 | Payer: Medicaid Other

## 2021-01-09 IMAGING — CR [ID]
1 series · 1 of 1 positions shown · non-contrast
Comparison: none

[x chest ap]
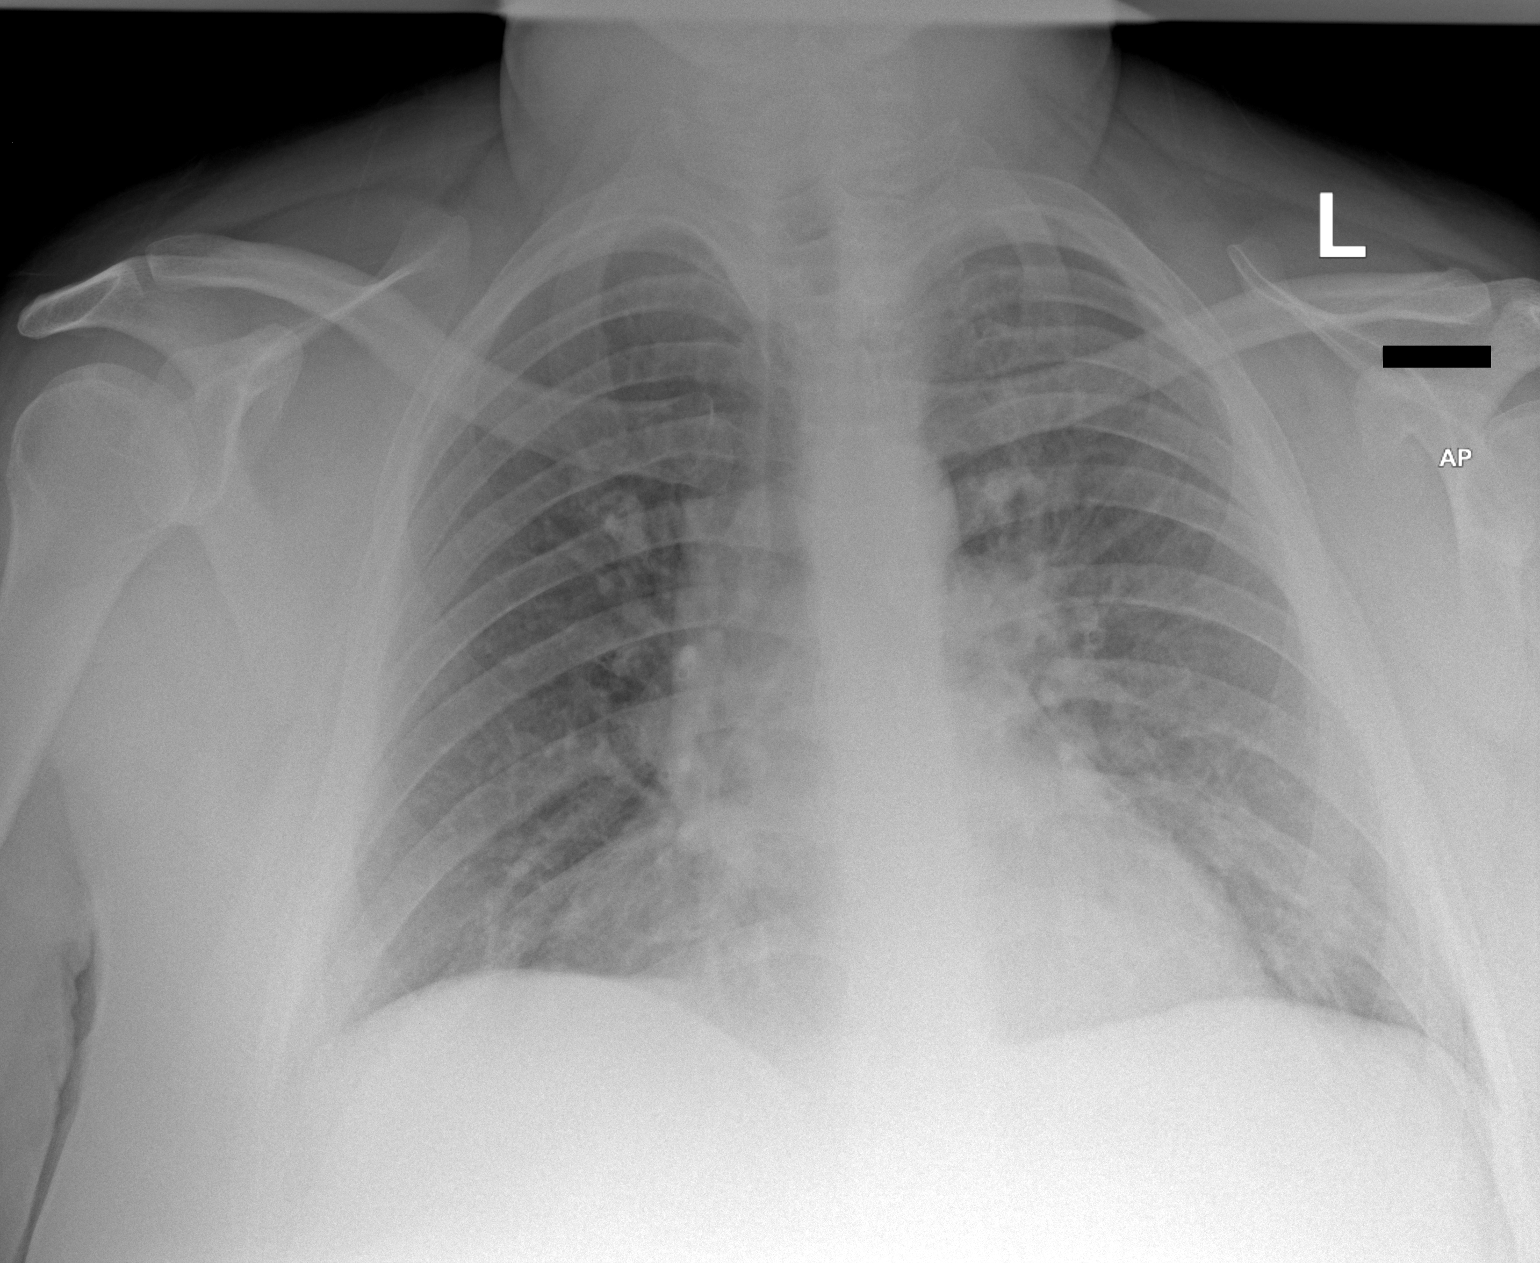

[1 of 1 positions shown; findings below may reference images not displayed]

DIAGNOSTIC STUDIES

EXAM

XR chest 1V

INDICATION

legs swollen
legs swollen- denies cough, sob

TECHNIQUE

Single-view

COMPARISONS

November 04, 2020

FINDINGS

Heart size is similar. Pulmonary vascular congestion. No pneumothorax. No acute osseous findings.

IMPRESSION

Pulmonary vascular congestion.

Tech Notes:

legs swollen- denies cough, sob

## 2021-01-23 ENCOUNTER — Encounter: Admit: 2021-01-23 | Discharge: 2021-01-23 | Payer: Medicaid Other

## 2021-01-23 ENCOUNTER — Ambulatory Visit: Admit: 2021-01-23 | Discharge: 2021-01-23 | Payer: Medicaid Other

## 2021-01-23 DIAGNOSIS — R609 Edema, unspecified: Secondary | ICD-10-CM

## 2021-02-13 ENCOUNTER — Encounter: Admit: 2021-02-13 | Discharge: 2021-02-13 | Payer: Medicaid Other

## 2021-02-13 DIAGNOSIS — G4733 Obstructive sleep apnea (adult) (pediatric): Secondary | ICD-10-CM

## 2021-02-13 DIAGNOSIS — G4734 Idiopathic sleep related nonobstructive alveolar hypoventilation: Secondary | ICD-10-CM

## 2021-02-13 NOTE — Progress Notes
Home sleep study reviewed. Demonstrates osa and nocturnal hypoxemia. Formal report to follow  ns

## 2021-03-14 IMAGING — CR [ID]
2 series · 2 of 2 positions shown · non-contrast
Comparison: none

[ankle ap]
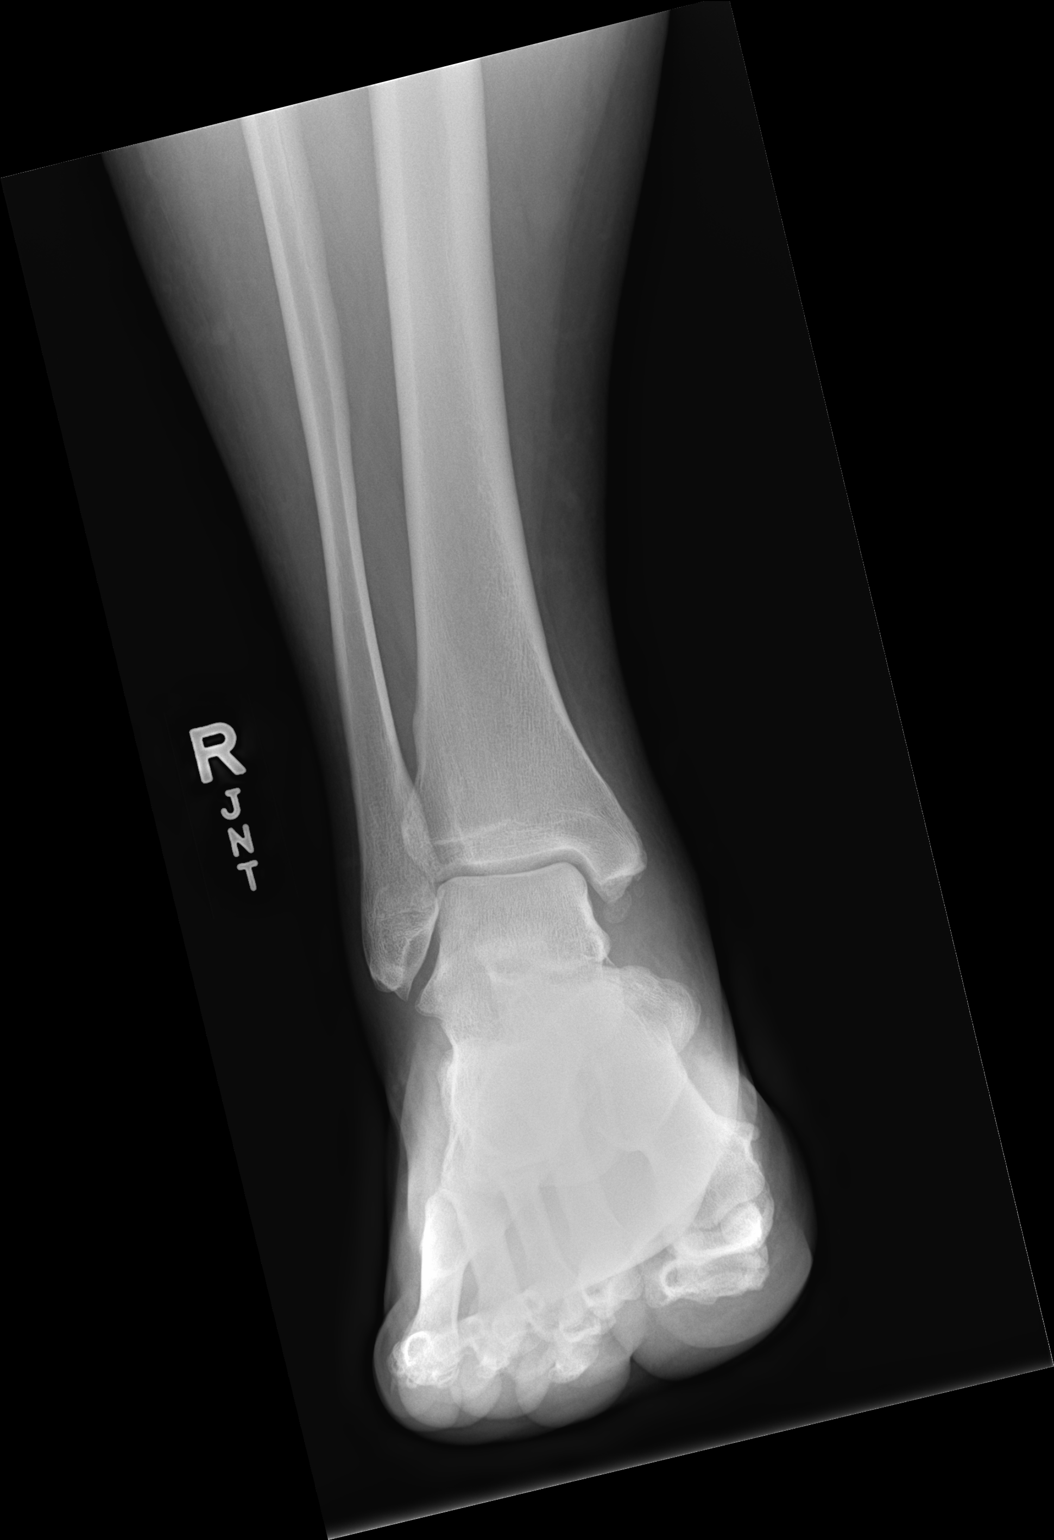

[ankle lat]
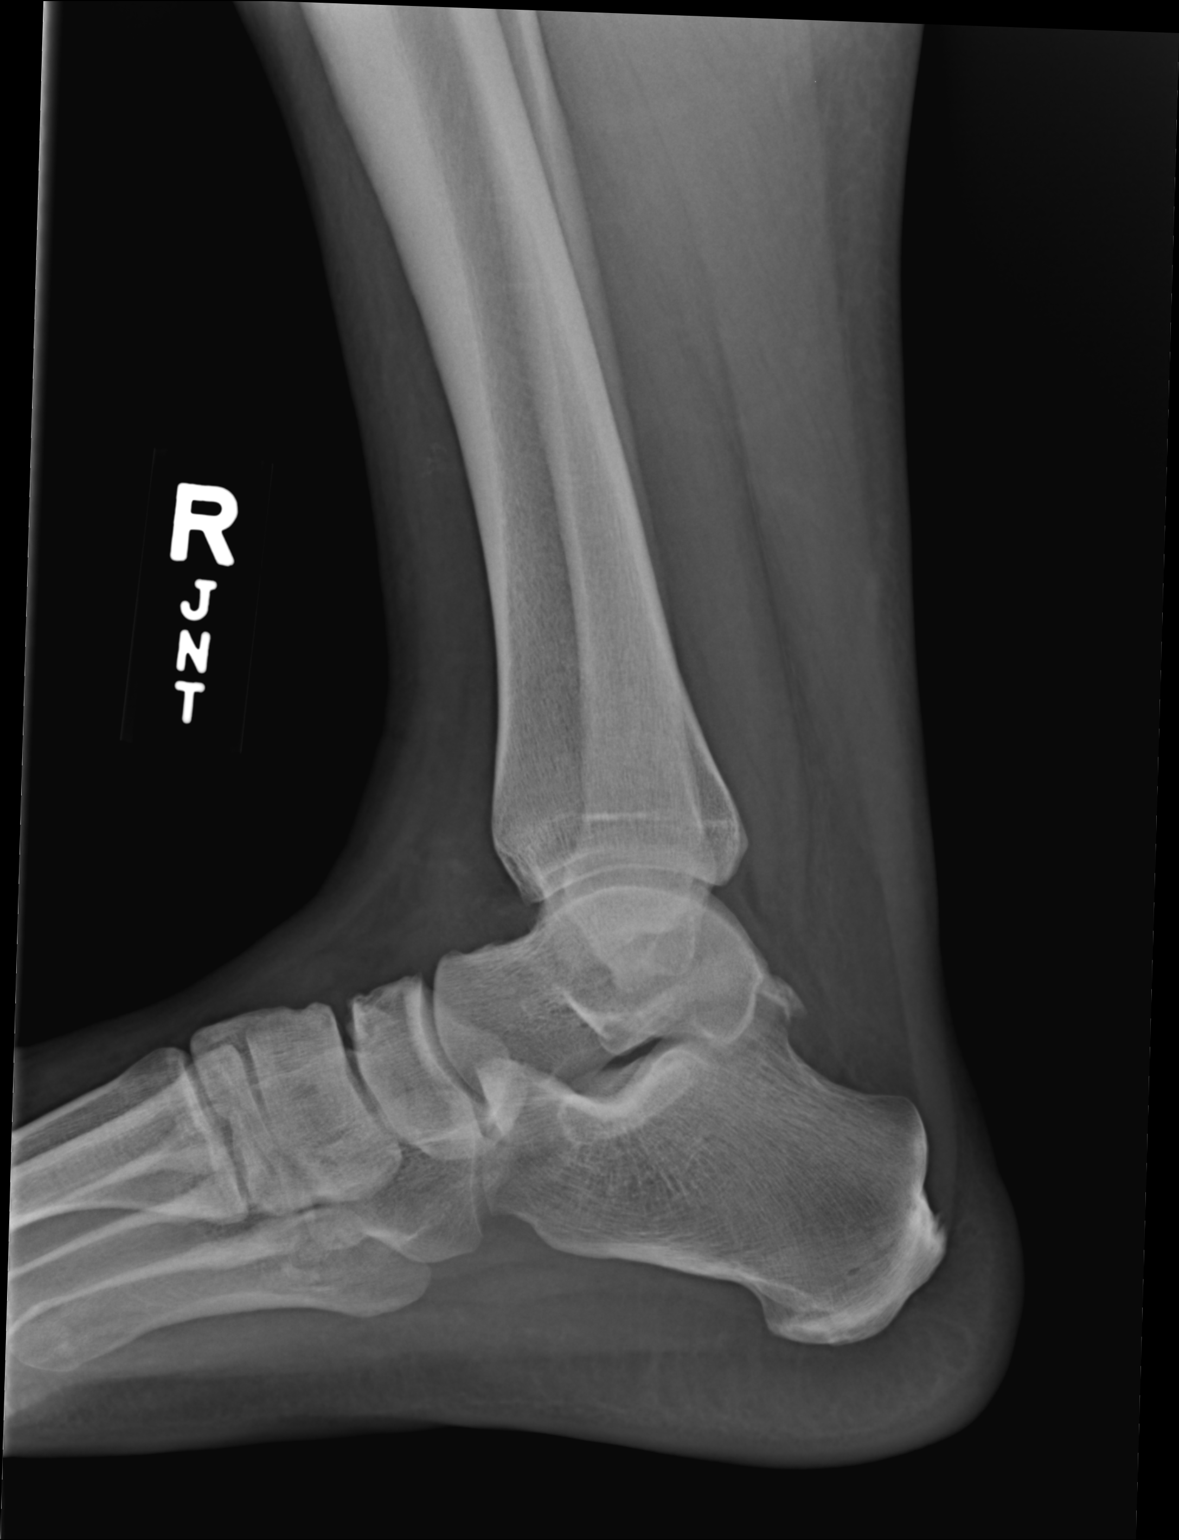

[2 of 2 positions shown; findings below may reference images not displayed]

EXAM

XR ankle RT 2V

INDICATION

ankle pain
Pt c/o right ankle pain on medial malleoli, he states ankle has been swollen x 1 month, hurts to
walk on ball of foot. JT

TECHNIQUE

Two views of the right ankle

COMPARISONS

None available at the time of dictation.

FINDINGS

No radiographic evidence of an acute fracture, osseous malalignment, or aggressive focal osseous
lesion. Well corticated ossific fragments inferior to the medial malleolus compatible with [place]
Bhabe/prior deltoid ligamentous avulsive injury. Suspected minimal spurring at the inferior aspect of
the anterior colliculus, which may be compatible with prior low lateral ankle ligamentous injury.
Small Achilles insertional enthesophyte. No tibiotalar joint effusion.

IMPRESSION
1. Sequelae of prior deltoid ligamentous and suspected anterior talofibular ligamentous avulsive
injury.

Tech Notes:

Pt c/o right ankle pain on medial malleoli, he states ankle has been swollen x 1 month, hurts to
walk on ball of foot. JT

## 2021-08-23 IMAGING — CT STONE PROTOCOL(Adult)
2 of 3 series · 14 of 46 positions shown, 16 images · non-contrast
Comparison: none

[Series 2: abdomen ax 2.00 br40 s3 · axial · 0.72mm/px · z∈[+1379,+1805]mm · 11 of 241 slices shown, 13 images]
[im 16/241  soft-tissue]
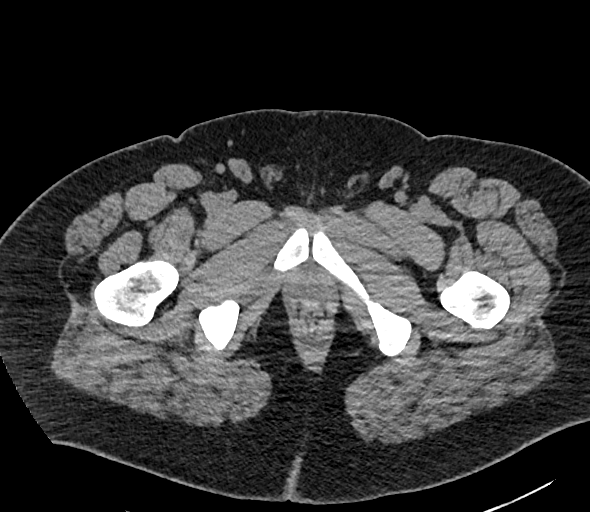
[im 16/241  bone]
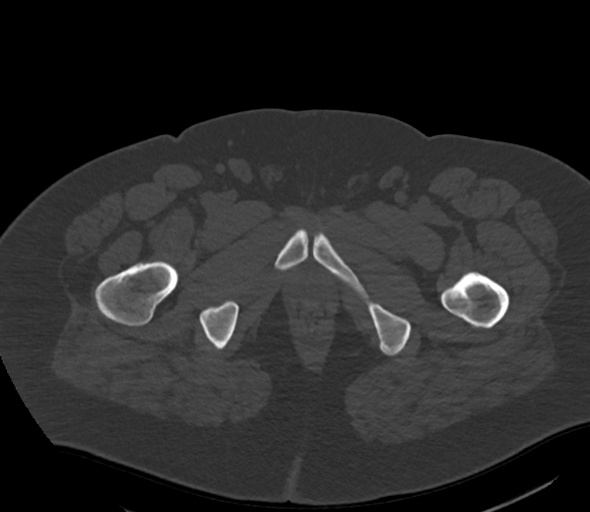
[im 39/241  soft-tissue]
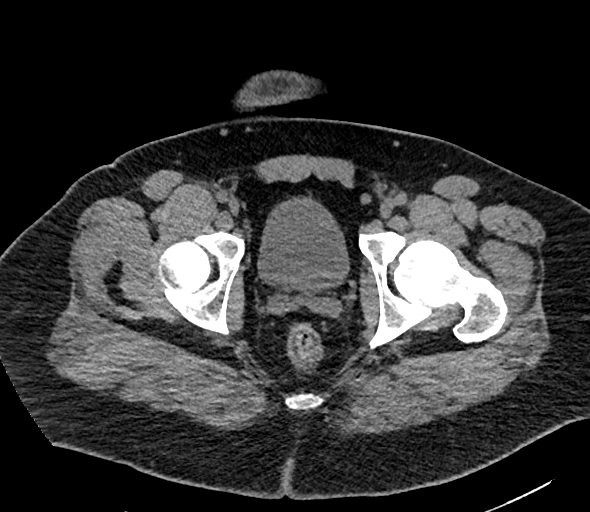
[im 55/241  soft-tissue]
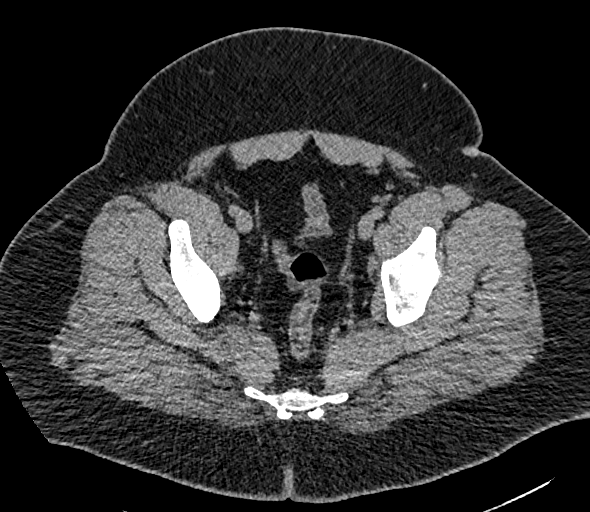
[im 78/241  soft-tissue]
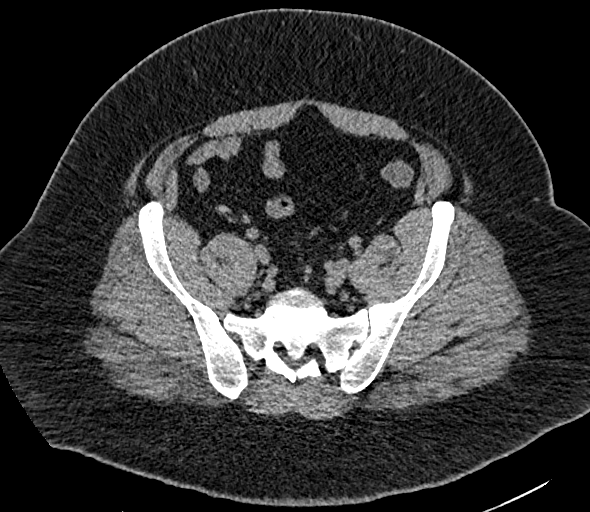
[im 101/241  soft-tissue]
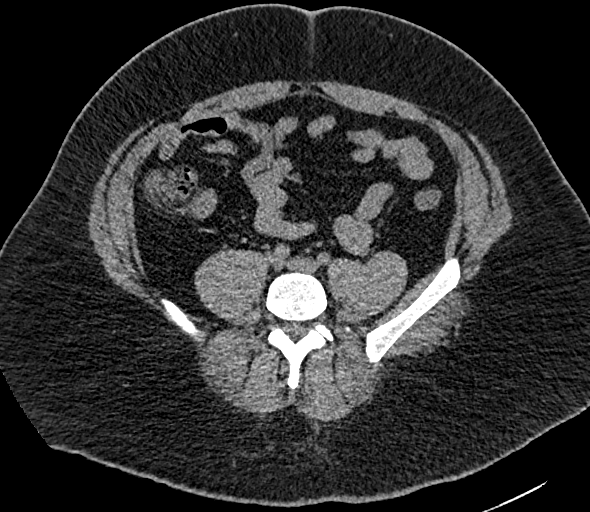
[im 124/241  soft-tissue]
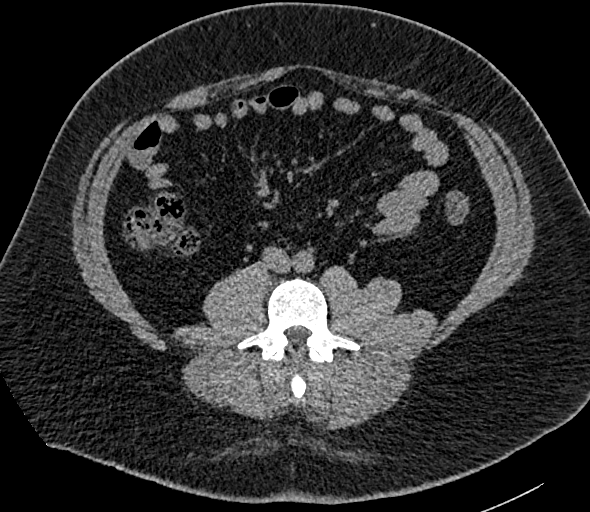
[im 140/241  soft-tissue]
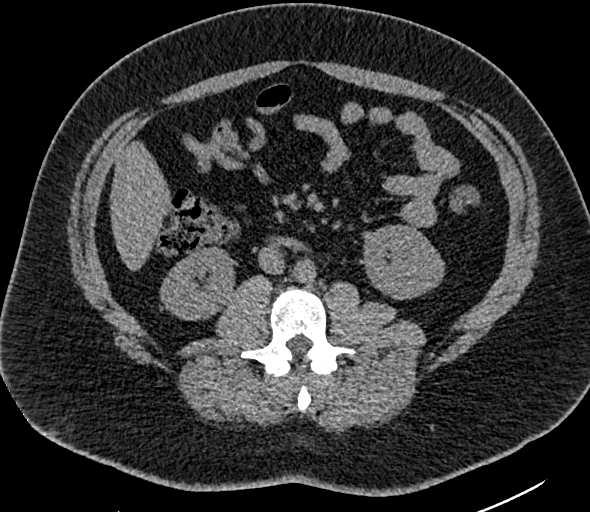
[im 163/241  soft-tissue]
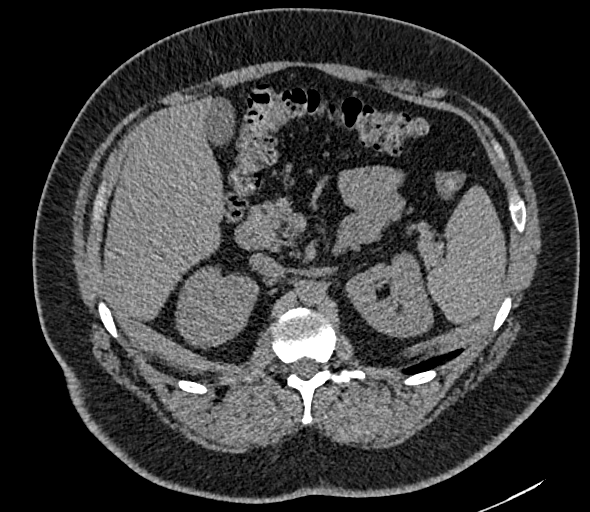
[im 186/241  soft-tissue]
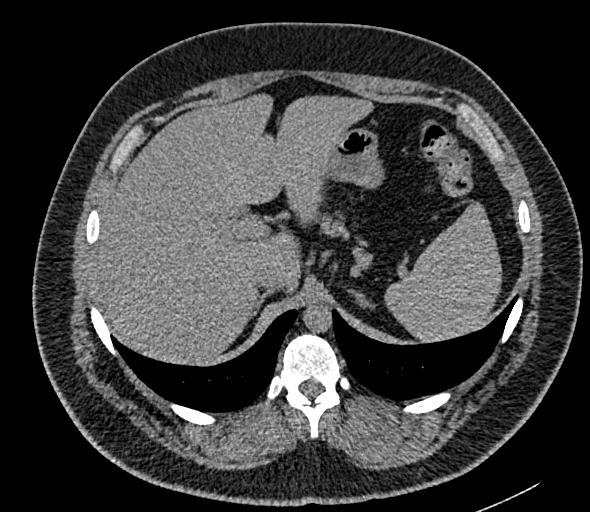
[im 186/241  bone]
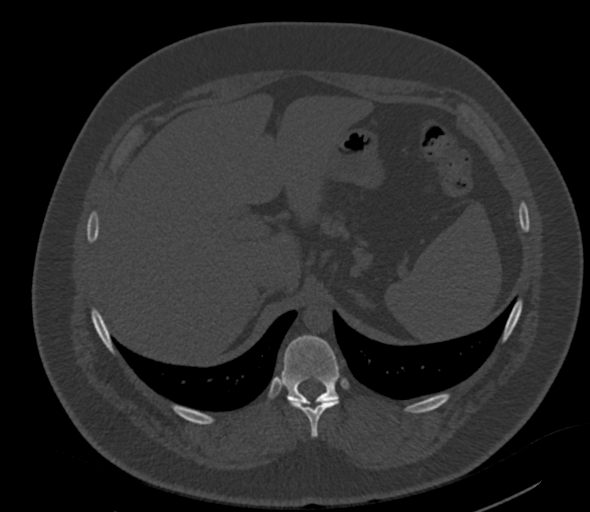
[im 202/241  soft-tissue]
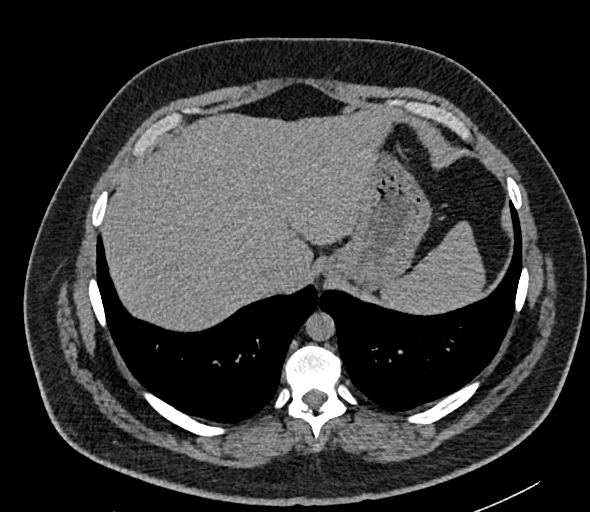
[im 225/241  soft-tissue]
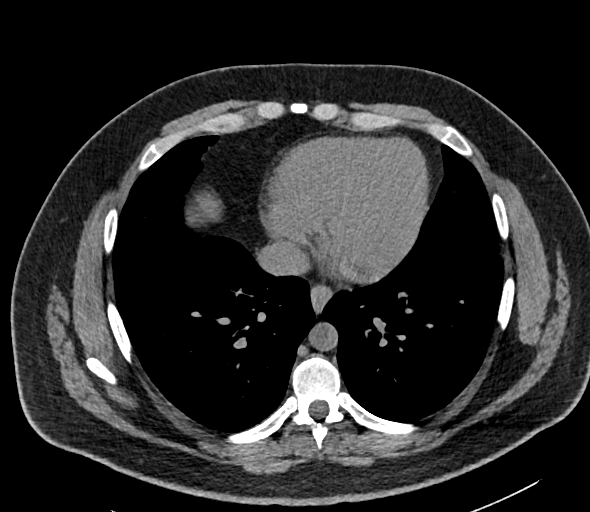

[Series 4: abdomen cor 2.00 br40 s3 · coronal · 0.83mm/px · 3 of 180 slices shown]
[im 60/180  soft-tissue]
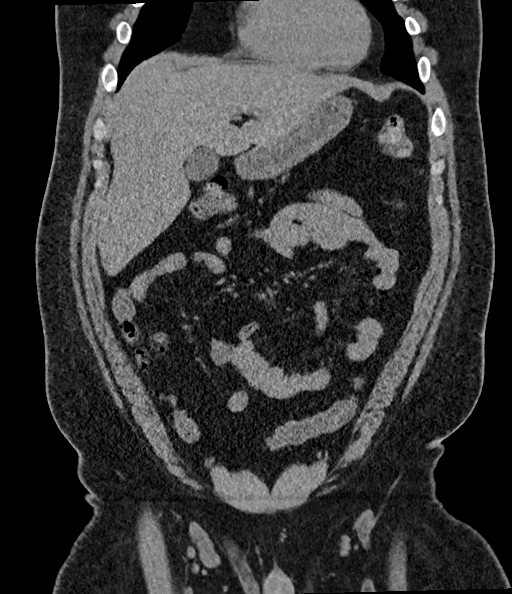
[im 80/180  soft-tissue]
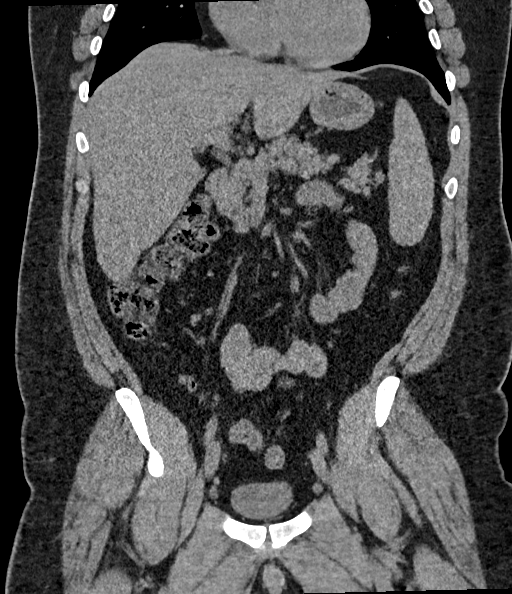
[im 100/180  soft-tissue]
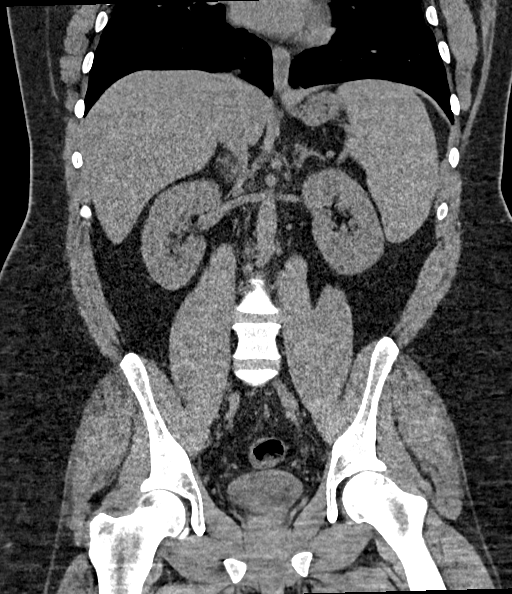

[14 of 46 positions shown; findings below may reference images not displayed]

DIAGNOSTIC STUDIES

EXAM

CT Abdomen/Pelvis w/Contrast

INDICATION

left flank/side pain
PT C/O LEFT SIDED FLANK PAIN. DENIES SX HX. ak  CT/NM:0/0

TECHNIQUE

CT of abdomen and pelvis was performed with contrast.

All CT scans at this facility use dose modulation, iterative reconstruction, and/or weight based
dosing when appropriate to reduce radiation dose to as low as reasonably achievable.

Number of previous computed tomography exams in the last 12 months is 0  .

Number of previous nuclear medicine myocardial perfusion studies in the last 12 months is 0  .

COMPARISONS

None available at the time of dictation.

FINDINGS

Visualized lower thorax: No significant abnormality.

Liver: Normal size and attenuation.

Spleen: Normal size and attenuation.

Gallbladder and biliary system: Normal.

Pancreas: Normal.

Adrenals: Normal.

Kidneys: No renal stones or hydronephrosis. There is no significant ureteral dilatation.

GI tract: The appendix is unremarkable. There is no significant small bowel dilatation or
inflammation.

Lymph nodes and mesentery: Normal.

Vasculature: Normal.

Bladder: Normal.

Reproductive organs: Normal.

Peritoneum: No free fluid.

Musculoskeletal structures: No significant abnormality.

Other: None.

IMPRESSION

Unremarkable limited non contrasted CT of the abdomen and pelvis.

Tech Notes:

PT C/O LEFT SIDED FLANK PAIN. DENIES SX HX. ak
CT/NM:0/0

## 2021-10-10 IMAGING — CR [ID]
4 series · 4 of 4 positions shown · non-contrast
Comparison: none

[w cervical spine lat]
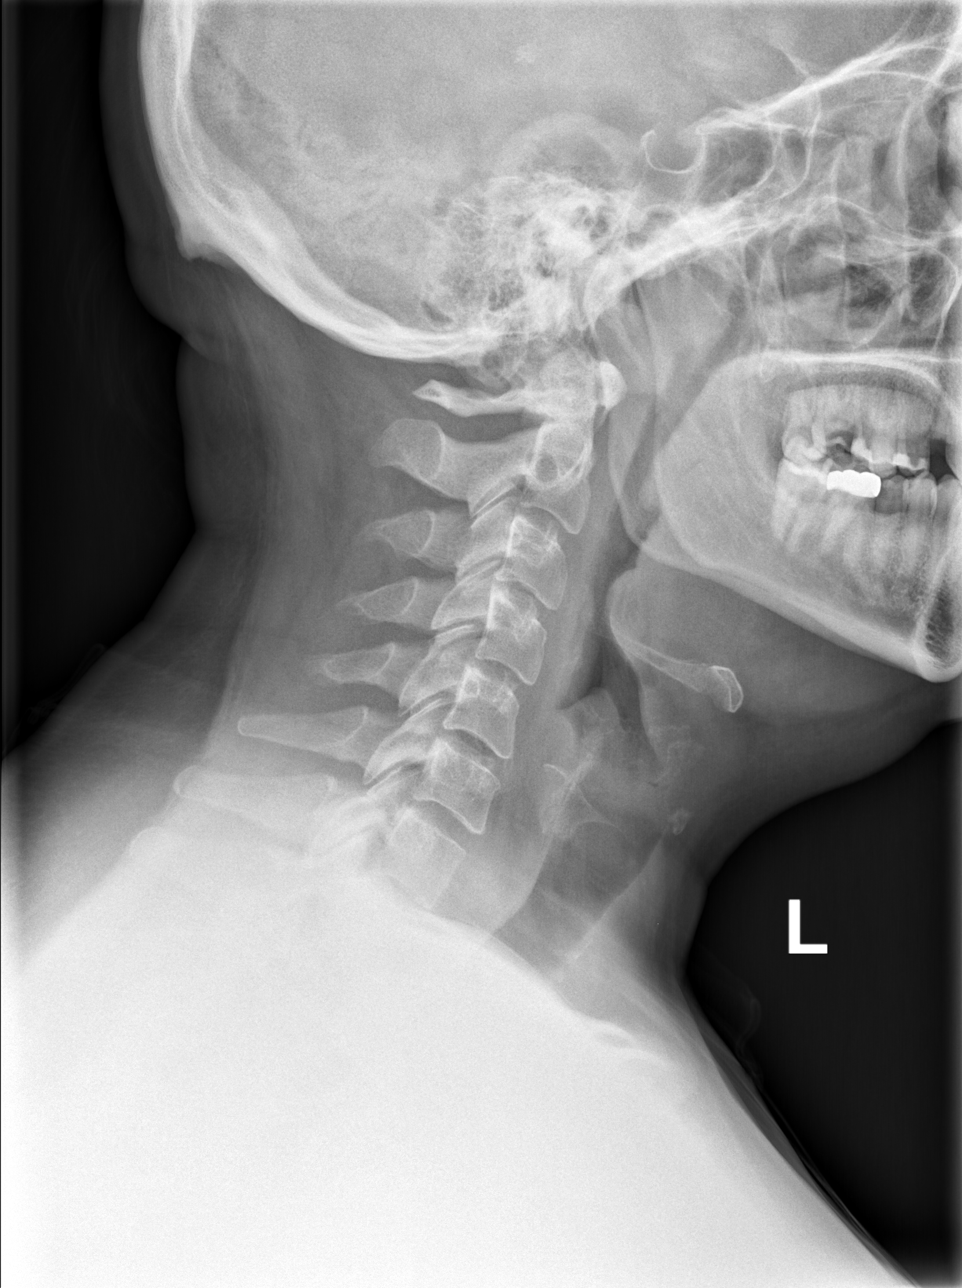

[w cervical spine ap]
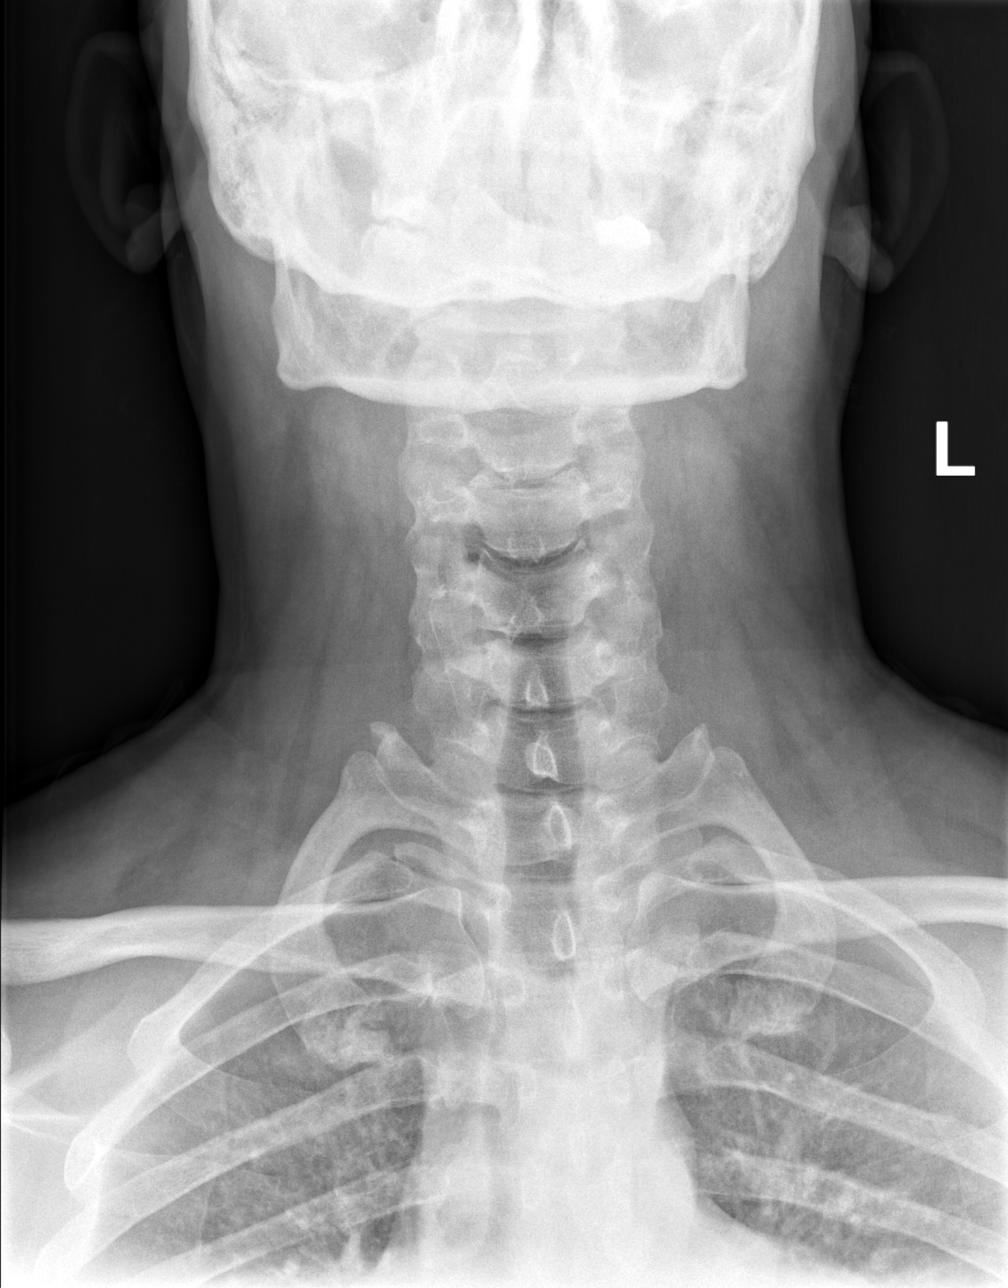

[w cervical spine odontoid (1 of 2)]
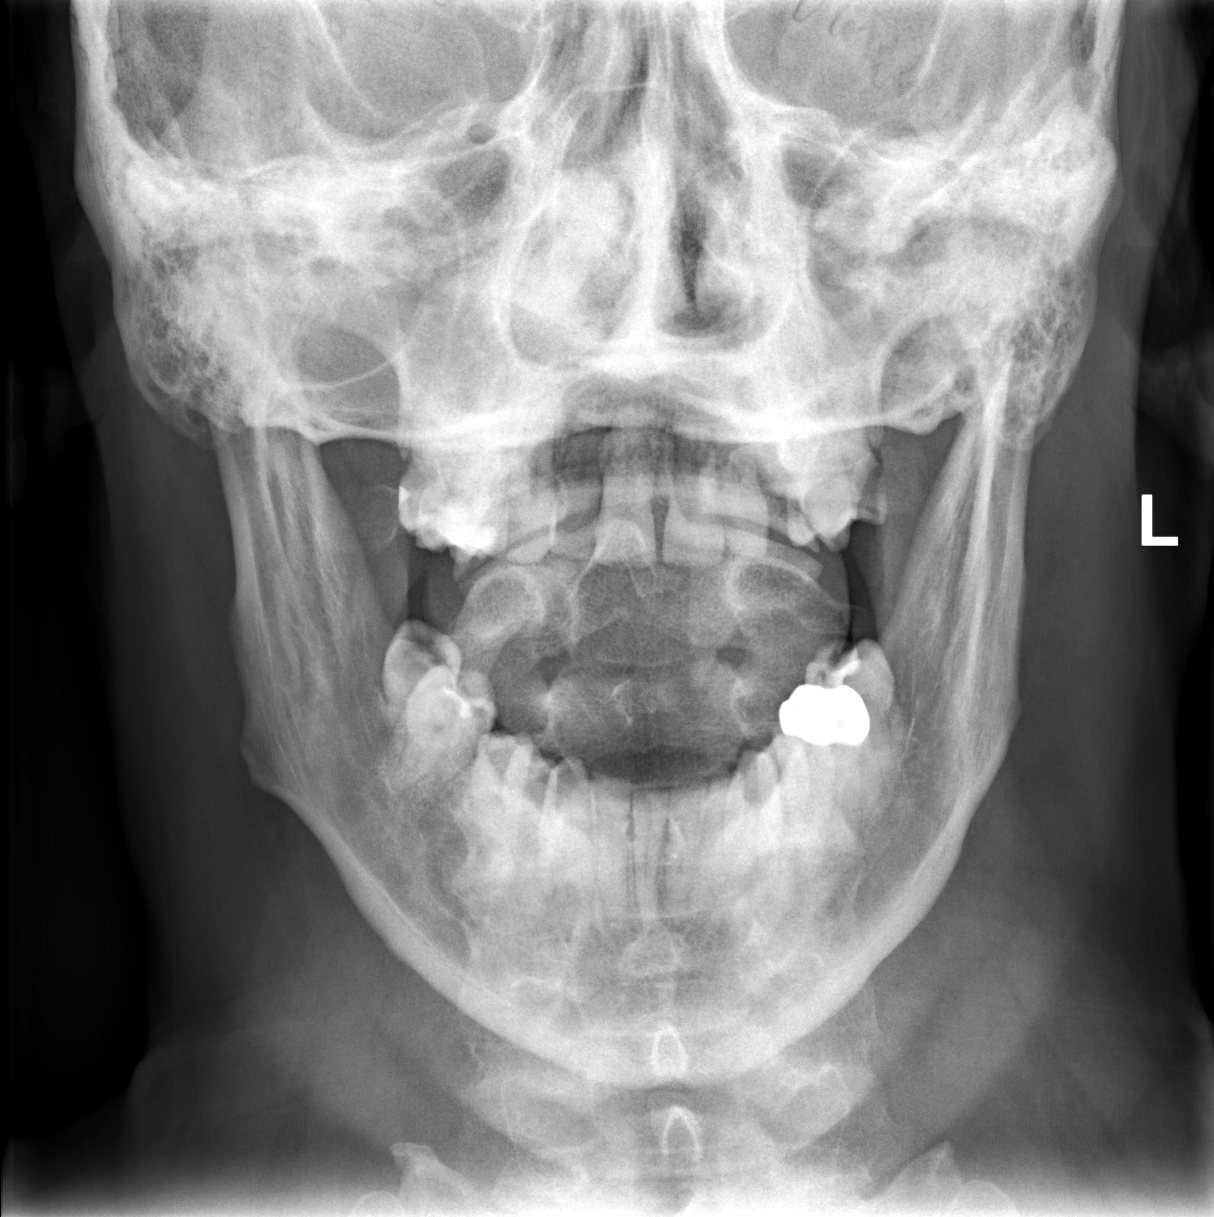

[w cervical spine odontoid (2 of 2)]
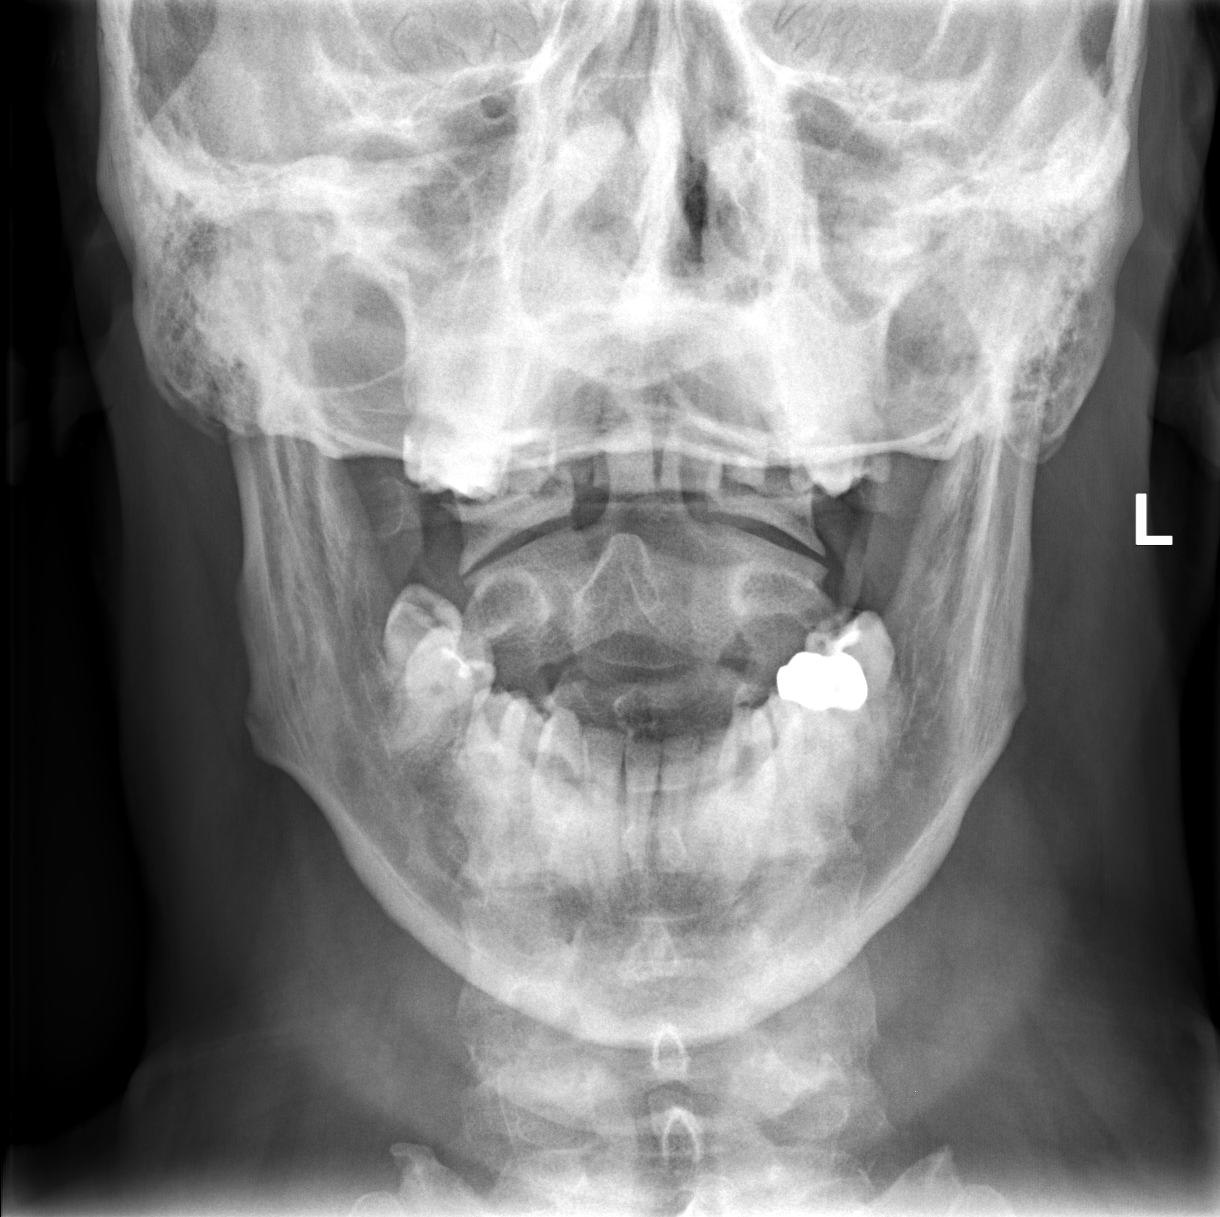

[4 of 4 positions shown; findings below may reference images not displayed]

EXAM

XR cervical spine 2V

INDICATION

Neck pain
onset pain within last 2 weeks,getting worse. stiffness in movements

TECHNIQUE

Four views of the cervical spine

COMPARISONS

None available at the time of dictation.

FINDINGS

No endplate compression fracture, spondylolisthesis, or spondylolysis. No prevertebral soft tissue
swelling

IMPRESSION
1. Normal radiographs of the cervical spine

Tech Notes:

onset pain within last 2 weeks,getting worse. stiffness in movements
